# Patient Record
Sex: Female | Born: 1975
Health system: Southern US, Community
[De-identification: ages and names within clinical notes are randomized; demographics above are authoritative.]

## PROBLEM LIST (undated history)

## (undated) DIAGNOSIS — M1990 Unspecified osteoarthritis, unspecified site: Secondary | ICD-10-CM

## (undated) DIAGNOSIS — N390 Urinary tract infection, site not specified: Secondary | ICD-10-CM

## (undated) DIAGNOSIS — T7840XA Allergy, unspecified, initial encounter: Secondary | ICD-10-CM

## (undated) DIAGNOSIS — Z8619 Personal history of other infectious and parasitic diseases: Secondary | ICD-10-CM

## (undated) DIAGNOSIS — G43909 Migraine, unspecified, not intractable, without status migrainosus: Secondary | ICD-10-CM

## (undated) DIAGNOSIS — H35039 Hypertensive retinopathy, unspecified eye: Secondary | ICD-10-CM

## (undated) DIAGNOSIS — E559 Vitamin D deficiency, unspecified: Secondary | ICD-10-CM

## (undated) DIAGNOSIS — E785 Hyperlipidemia, unspecified: Secondary | ICD-10-CM

## (undated) DIAGNOSIS — I10 Essential (primary) hypertension: Secondary | ICD-10-CM

## (undated) HISTORY — DX: Urinary tract infection, site not specified: N39.0

## (undated) HISTORY — DX: Hypertensive retinopathy, unspecified eye: H35.039

## (undated) HISTORY — DX: Unspecified osteoarthritis, unspecified site: M19.90

## (undated) HISTORY — PX: TONSILLECTOMY: SUR1361

## (undated) HISTORY — DX: Migraine, unspecified, not intractable, without status migrainosus: G43.909

## (undated) HISTORY — DX: Personal history of other infectious and parasitic diseases: Z86.19

## (undated) HISTORY — PX: ABDOMINAL HYSTERECTOMY: SHX81

## (undated) HISTORY — PX: REDUCTION MAMMAPLASTY: SUR839

## (undated) HISTORY — DX: Allergy, unspecified, initial encounter: T78.40XA

## (undated) HISTORY — DX: Vitamin D deficiency, unspecified: E55.9

## (undated) HISTORY — PX: TOTAL ABDOMINAL HYSTERECTOMY: SHX209

## (undated) HISTORY — DX: Hyperlipidemia, unspecified: E78.5

---

## 2000-02-14 HISTORY — PX: BREAST REDUCTION SURGERY: SHX8

## 2005-02-13 HISTORY — PX: TOTAL ABDOMINAL HYSTERECTOMY: SHX209

## 2005-02-13 HISTORY — PX: BLADDER SUSPENSION: SHX72

## 2014-12-04 LAB — HM HEPATITIS C SCREENING LAB: HM Hepatitis Screen: NEGATIVE

## 2015-02-12 DIAGNOSIS — K760 Fatty (change of) liver, not elsewhere classified: Secondary | ICD-10-CM

## 2015-02-12 HISTORY — DX: Fatty (change of) liver, not elsewhere classified: K76.0

## 2016-10-18 DIAGNOSIS — K5790 Diverticulosis of intestine, part unspecified, without perforation or abscess without bleeding: Secondary | ICD-10-CM

## 2016-10-18 HISTORY — PX: COLONOSCOPY: SHX174

## 2016-10-18 HISTORY — DX: Diverticulosis of intestine, part unspecified, without perforation or abscess without bleeding: K57.90

## 2018-04-04 LAB — HM HIV SCREENING LAB: HM HIV Screening: NEGATIVE

## 2018-10-31 ENCOUNTER — Other Ambulatory Visit: Payer: Self-pay

## 2018-10-31 ENCOUNTER — Ambulatory Visit (HOSPITAL_COMMUNITY)
Admission: EM | Admit: 2018-10-31 | Discharge: 2018-10-31 | Disposition: A | Discharge status: Home / Self Care | Payer: BC Managed Care – PPO

## 2018-10-31 ENCOUNTER — Encounter (HOSPITAL_COMMUNITY): Payer: Self-pay

## 2018-10-31 ENCOUNTER — Ambulatory Visit (INDEPENDENT_AMBULATORY_CARE_PROVIDER_SITE_OTHER): Payer: BC Managed Care – PPO

## 2018-10-31 DIAGNOSIS — S300XXA Contusion of lower back and pelvis, initial encounter: Secondary | ICD-10-CM | POA: Diagnosis not present

## 2018-10-31 DIAGNOSIS — W19XXXA Unspecified fall, initial encounter: Secondary | ICD-10-CM | POA: Diagnosis not present

## 2018-10-31 DIAGNOSIS — M545 Low back pain, unspecified: Secondary | ICD-10-CM

## 2018-10-31 DIAGNOSIS — Z8739 Personal history of other diseases of the musculoskeletal system and connective tissue: Secondary | ICD-10-CM

## 2018-10-31 DIAGNOSIS — W109XXA Fall (on) (from) unspecified stairs and steps, initial encounter: Secondary | ICD-10-CM | POA: Diagnosis not present

## 2018-10-31 DIAGNOSIS — T148XXA Other injury of unspecified body region, initial encounter: Secondary | ICD-10-CM

## 2018-10-31 HISTORY — DX: Essential (primary) hypertension: I10

## 2018-10-31 MED ORDER — KETOROLAC TROMETHAMINE 60 MG/2ML IM SOLN
INTRAMUSCULAR | Status: AC
  Filled 2018-10-31: qty 2

## 2018-10-31 MED ORDER — NAPROXEN 500 MG PO TABS: 500.0000 mg | ORAL_TABLET | Freq: Two times a day (BID) | ORAL | 0 refills | Status: DC

## 2018-10-31 MED ORDER — CYCLOBENZAPRINE HCL 5 MG PO TABS: 5.0000 mg | ORAL_TABLET | Freq: Three times a day (TID) | ORAL | 0 refills | Status: DC | PRN

## 2018-10-31 MED ORDER — KETOROLAC TROMETHAMINE 60 MG/2ML IM SOLN
60.0000 mg | Freq: Once | INTRAMUSCULAR | Status: AC
  Administered 2018-10-31: 60 mg via INTRAMUSCULAR

## 2018-10-31 NOTE — ED Provider Notes (Signed)
MRN: [id number] DOB: 08-13-1975  Subjective:   Kelli May is a 43 y.o. female presenting for acute onset moderate to severe worsening low back pain s/p fall directly onto stairs today.  Patient slipped, fell backwards onto her low back and feels like her purse broke some of her fall.  She did hurt her head as well but very lightly.  She is having a difficult time ambulating due to her pain.  Denies loss of consciousness.  Denies weakness, incontinence, inability to defecate or urinate, numbness or tingling, laceration, weakness.  She has not taken anything for pain as she came straight here.  Reports a history of scoliosis and arthritis.   No current facility-administered medications for this encounter.   Current Outpatient Medications:  .  calcium citrate-vitamin D (CITRACAL+D) 315-200 MG-UNIT tablet, Take 1 tablet by mouth 2 (two) times daily., Disp: , Rfl:  .  cholecalciferol (VITAMIN D3) 25 MCG (1000 UT) tablet, Take 1,000 Units by mouth daily., Disp: , Rfl:  .  cyclobenzaprine (FLEXERIL) 5 MG tablet, Take 5 mg by mouth 3 (three) times daily as needed for muscle spasms., Disp: , Rfl:  .  lisinopril (ZESTRIL) 5 MG tablet, Take 5 mg by mouth daily., Disp: , Rfl:  .  montelukast (SINGULAIR) 10 MG tablet, Take 10 mg by mouth at bedtime., Disp: , Rfl:     Allergies  Allergen Reactions  . Penicillins Hives    Past Medical History:  Diagnosis Date  . Hypertension      Past Surgical History:  Procedure Laterality Date  . ABDOMINAL HYSTERECTOMY      ROS  Objective:   Vitals: BP (!) 126/93 (BP Location: Right Arm)   Pulse 98   Temp 97.9 F (36.6 C) (Oral)   Resp 18   Wt 197 lb (89.4 kg)   SpO2 98%   Physical Exam Constitutional:      General: She is not in acute distress.    Appearance: Normal appearance. She is well-developed. She is not ill-appearing.  HENT:     Head: Normocephalic and atraumatic.     Nose: Nose normal.     Mouth/Throat:     Mouth: Mucous  membranes are moist.     Pharynx: Oropharynx is clear.  Eyes:     General: No scleral icterus.    Extraocular Movements: Extraocular movements intact.     Pupils: Pupils are equal, round, and reactive to light.  Cardiovascular:     Rate and Rhythm: Normal rate.  Pulmonary:     Effort: Pulmonary effort is normal.  Musculoskeletal:     Lumbar back: She exhibits decreased range of motion, tenderness and swelling (Associated with her abrasion). She exhibits no deformity and no laceration.       Back:  Skin:    General: Skin is warm and dry.  Neurological:     General: No focal deficit present.     Mental Status: She is alert and oriented to person, place, and time.     Cranial Nerves: No cranial nerve deficit.     Motor: No weakness.     Coordination: Coordination abnormal (difficulty rising from a seated position due to her low back pain).     Deep Tendon Reflexes: Reflexes normal.  Psychiatric:        Mood and Affect: Mood normal.        Behavior: Behavior normal.    Dg Lumbar Spine Complete  Result Date: 10/31/2018 CLINICAL DATA:  Per pt: around 11 this  morning, fell down four steps in the house. Pain is the lumbar spine, posterior, left, lateral to the 4th & 5th lumbar. No prior injury to the lumbar spine. Has scoliosis, osteoarthritis. Non smoker. Patient is not a diabetic EXAM: LUMBAR SPINE - COMPLETE 4+ VIEW COMPARISON:  None. FINDINGS: There is no evidence of lumbar spine fracture. Alignment is normal. Intervertebral disc spaces are maintained. IMPRESSION: Negative. Electronically Signed   By: Lajean Manes M.D.   On: 10/31/2018 14:28    Assessment and Plan :   1. Acute left-sided low back pain without sciatica   2. Fall, initial encounter   3. Abrasion   4. Contusion of lower back, initial encounter   5. History of scoliosis     We will manage conservatively for musculoskeletal type pain associated with her fall.  Counseled on use of NSAID, muscle relaxant and  modification of physical activity.  Anticipatory guidance provided.  Counseled patient on potential for adverse effects with medications prescribed/recommended today, ER and return-to-clinic precautions discussed, patient verbalized understanding.    Jaynee Eagles, PA-C 10/31/18 1438

## 2018-10-31 NOTE — ED Triage Notes (Signed)
Pt states she fall down some steps. This happened today. Pt states she having lower back pain.

## 2019-02-10 DIAGNOSIS — R0602 Shortness of breath: Secondary | ICD-10-CM | POA: Diagnosis not present

## 2019-03-24 DIAGNOSIS — S134XXA Sprain of ligaments of cervical spine, initial encounter: Secondary | ICD-10-CM | POA: Diagnosis not present

## 2019-03-24 DIAGNOSIS — S233XXA Sprain of ligaments of thoracic spine, initial encounter: Secondary | ICD-10-CM | POA: Diagnosis not present

## 2019-03-24 DIAGNOSIS — S338XXA Sprain of other parts of lumbar spine and pelvis, initial encounter: Secondary | ICD-10-CM | POA: Diagnosis not present

## 2019-03-31 DIAGNOSIS — S134XXA Sprain of ligaments of cervical spine, initial encounter: Secondary | ICD-10-CM | POA: Diagnosis not present

## 2019-03-31 DIAGNOSIS — S338XXA Sprain of other parts of lumbar spine and pelvis, initial encounter: Secondary | ICD-10-CM | POA: Diagnosis not present

## 2019-03-31 DIAGNOSIS — S233XXA Sprain of ligaments of thoracic spine, initial encounter: Secondary | ICD-10-CM | POA: Diagnosis not present

## 2019-04-07 DIAGNOSIS — S134XXA Sprain of ligaments of cervical spine, initial encounter: Secondary | ICD-10-CM | POA: Diagnosis not present

## 2019-04-07 DIAGNOSIS — S233XXA Sprain of ligaments of thoracic spine, initial encounter: Secondary | ICD-10-CM | POA: Diagnosis not present

## 2019-04-07 DIAGNOSIS — S338XXA Sprain of other parts of lumbar spine and pelvis, initial encounter: Secondary | ICD-10-CM | POA: Diagnosis not present

## 2019-04-09 DIAGNOSIS — S134XXA Sprain of ligaments of cervical spine, initial encounter: Secondary | ICD-10-CM | POA: Diagnosis not present

## 2019-04-09 DIAGNOSIS — S233XXA Sprain of ligaments of thoracic spine, initial encounter: Secondary | ICD-10-CM | POA: Diagnosis not present

## 2019-04-09 DIAGNOSIS — S338XXA Sprain of other parts of lumbar spine and pelvis, initial encounter: Secondary | ICD-10-CM | POA: Diagnosis not present

## 2019-04-16 DIAGNOSIS — S134XXA Sprain of ligaments of cervical spine, initial encounter: Secondary | ICD-10-CM | POA: Diagnosis not present

## 2019-04-16 DIAGNOSIS — S338XXA Sprain of other parts of lumbar spine and pelvis, initial encounter: Secondary | ICD-10-CM | POA: Diagnosis not present

## 2019-04-16 DIAGNOSIS — S233XXA Sprain of ligaments of thoracic spine, initial encounter: Secondary | ICD-10-CM | POA: Diagnosis not present

## 2019-04-21 DIAGNOSIS — S338XXA Sprain of other parts of lumbar spine and pelvis, initial encounter: Secondary | ICD-10-CM | POA: Diagnosis not present

## 2019-04-21 DIAGNOSIS — S134XXA Sprain of ligaments of cervical spine, initial encounter: Secondary | ICD-10-CM | POA: Diagnosis not present

## 2019-04-21 DIAGNOSIS — S233XXA Sprain of ligaments of thoracic spine, initial encounter: Secondary | ICD-10-CM | POA: Diagnosis not present

## 2019-04-23 DIAGNOSIS — S233XXA Sprain of ligaments of thoracic spine, initial encounter: Secondary | ICD-10-CM | POA: Diagnosis not present

## 2019-04-23 DIAGNOSIS — S338XXA Sprain of other parts of lumbar spine and pelvis, initial encounter: Secondary | ICD-10-CM | POA: Diagnosis not present

## 2019-04-23 DIAGNOSIS — S134XXA Sprain of ligaments of cervical spine, initial encounter: Secondary | ICD-10-CM | POA: Diagnosis not present

## 2019-04-28 DIAGNOSIS — S233XXA Sprain of ligaments of thoracic spine, initial encounter: Secondary | ICD-10-CM | POA: Diagnosis not present

## 2019-04-28 DIAGNOSIS — S134XXA Sprain of ligaments of cervical spine, initial encounter: Secondary | ICD-10-CM | POA: Diagnosis not present

## 2019-04-28 DIAGNOSIS — S338XXA Sprain of other parts of lumbar spine and pelvis, initial encounter: Secondary | ICD-10-CM | POA: Diagnosis not present

## 2019-05-05 DIAGNOSIS — S233XXA Sprain of ligaments of thoracic spine, initial encounter: Secondary | ICD-10-CM | POA: Diagnosis not present

## 2019-05-05 DIAGNOSIS — S134XXA Sprain of ligaments of cervical spine, initial encounter: Secondary | ICD-10-CM | POA: Diagnosis not present

## 2019-05-05 DIAGNOSIS — S338XXA Sprain of other parts of lumbar spine and pelvis, initial encounter: Secondary | ICD-10-CM | POA: Diagnosis not present

## 2019-05-14 DIAGNOSIS — S233XXA Sprain of ligaments of thoracic spine, initial encounter: Secondary | ICD-10-CM | POA: Diagnosis not present

## 2019-05-14 DIAGNOSIS — S134XXA Sprain of ligaments of cervical spine, initial encounter: Secondary | ICD-10-CM | POA: Diagnosis not present

## 2019-05-14 DIAGNOSIS — S338XXA Sprain of other parts of lumbar spine and pelvis, initial encounter: Secondary | ICD-10-CM | POA: Diagnosis not present

## 2019-05-21 DIAGNOSIS — S233XXA Sprain of ligaments of thoracic spine, initial encounter: Secondary | ICD-10-CM | POA: Diagnosis not present

## 2019-05-21 DIAGNOSIS — S134XXA Sprain of ligaments of cervical spine, initial encounter: Secondary | ICD-10-CM | POA: Diagnosis not present

## 2019-05-21 DIAGNOSIS — S338XXA Sprain of other parts of lumbar spine and pelvis, initial encounter: Secondary | ICD-10-CM | POA: Diagnosis not present

## 2019-05-28 DIAGNOSIS — S233XXA Sprain of ligaments of thoracic spine, initial encounter: Secondary | ICD-10-CM | POA: Diagnosis not present

## 2019-05-28 DIAGNOSIS — S338XXA Sprain of other parts of lumbar spine and pelvis, initial encounter: Secondary | ICD-10-CM | POA: Diagnosis not present

## 2019-05-28 DIAGNOSIS — S134XXA Sprain of ligaments of cervical spine, initial encounter: Secondary | ICD-10-CM | POA: Diagnosis not present

## 2019-06-02 DIAGNOSIS — S134XXA Sprain of ligaments of cervical spine, initial encounter: Secondary | ICD-10-CM | POA: Diagnosis not present

## 2019-06-02 DIAGNOSIS — S338XXA Sprain of other parts of lumbar spine and pelvis, initial encounter: Secondary | ICD-10-CM | POA: Diagnosis not present

## 2019-06-02 DIAGNOSIS — S233XXA Sprain of ligaments of thoracic spine, initial encounter: Secondary | ICD-10-CM | POA: Diagnosis not present

## 2019-06-19 ENCOUNTER — Ambulatory Visit: Payer: BC Managed Care – PPO | Admitting: Family Medicine

## 2019-06-25 DIAGNOSIS — S134XXA Sprain of ligaments of cervical spine, initial encounter: Secondary | ICD-10-CM | POA: Diagnosis not present

## 2019-06-25 DIAGNOSIS — S338XXA Sprain of other parts of lumbar spine and pelvis, initial encounter: Secondary | ICD-10-CM | POA: Diagnosis not present

## 2019-06-25 DIAGNOSIS — S233XXA Sprain of ligaments of thoracic spine, initial encounter: Secondary | ICD-10-CM | POA: Diagnosis not present

## 2019-07-07 DIAGNOSIS — S134XXA Sprain of ligaments of cervical spine, initial encounter: Secondary | ICD-10-CM | POA: Diagnosis not present

## 2019-07-07 DIAGNOSIS — S233XXA Sprain of ligaments of thoracic spine, initial encounter: Secondary | ICD-10-CM | POA: Diagnosis not present

## 2019-07-07 DIAGNOSIS — S338XXA Sprain of other parts of lumbar spine and pelvis, initial encounter: Secondary | ICD-10-CM | POA: Diagnosis not present

## 2019-07-16 DIAGNOSIS — S233XXA Sprain of ligaments of thoracic spine, initial encounter: Secondary | ICD-10-CM | POA: Diagnosis not present

## 2019-07-16 DIAGNOSIS — S134XXA Sprain of ligaments of cervical spine, initial encounter: Secondary | ICD-10-CM | POA: Diagnosis not present

## 2019-07-16 DIAGNOSIS — S338XXA Sprain of other parts of lumbar spine and pelvis, initial encounter: Secondary | ICD-10-CM | POA: Diagnosis not present

## 2019-07-28 DIAGNOSIS — S134XXA Sprain of ligaments of cervical spine, initial encounter: Secondary | ICD-10-CM | POA: Diagnosis not present

## 2019-07-28 DIAGNOSIS — S233XXA Sprain of ligaments of thoracic spine, initial encounter: Secondary | ICD-10-CM | POA: Diagnosis not present

## 2019-07-28 DIAGNOSIS — S338XXA Sprain of other parts of lumbar spine and pelvis, initial encounter: Secondary | ICD-10-CM | POA: Diagnosis not present

## 2019-08-11 DIAGNOSIS — S233XXA Sprain of ligaments of thoracic spine, initial encounter: Secondary | ICD-10-CM | POA: Diagnosis not present

## 2019-08-11 DIAGNOSIS — S134XXA Sprain of ligaments of cervical spine, initial encounter: Secondary | ICD-10-CM | POA: Diagnosis not present

## 2019-08-11 DIAGNOSIS — S338XXA Sprain of other parts of lumbar spine and pelvis, initial encounter: Secondary | ICD-10-CM | POA: Diagnosis not present

## 2019-08-15 ENCOUNTER — Other Ambulatory Visit: Payer: Self-pay

## 2019-08-15 ENCOUNTER — Ambulatory Visit (INDEPENDENT_AMBULATORY_CARE_PROVIDER_SITE_OTHER): Payer: BC Managed Care – PPO | Admitting: Family Medicine

## 2019-08-15 ENCOUNTER — Encounter: Payer: Self-pay | Admitting: Family Medicine

## 2019-08-15 ENCOUNTER — Ambulatory Visit (INDEPENDENT_AMBULATORY_CARE_PROVIDER_SITE_OTHER): Payer: BC Managed Care – PPO

## 2019-08-15 VITALS — BP 111/75 | HR 61 | Temp 98.2°F | Ht 66.0 in | Wt 229.6 lb

## 2019-08-15 DIAGNOSIS — I10 Essential (primary) hypertension: Secondary | ICD-10-CM | POA: Diagnosis not present

## 2019-08-15 DIAGNOSIS — J302 Other seasonal allergic rhinitis: Secondary | ICD-10-CM

## 2019-08-15 DIAGNOSIS — R5383 Other fatigue: Secondary | ICD-10-CM

## 2019-08-15 DIAGNOSIS — R42 Dizziness and giddiness: Secondary | ICD-10-CM | POA: Diagnosis not present

## 2019-08-15 DIAGNOSIS — R0602 Shortness of breath: Secondary | ICD-10-CM

## 2019-08-15 DIAGNOSIS — E559 Vitamin D deficiency, unspecified: Secondary | ICD-10-CM | POA: Diagnosis not present

## 2019-08-15 DIAGNOSIS — R519 Headache, unspecified: Secondary | ICD-10-CM

## 2019-08-15 MED ORDER — CYCLOBENZAPRINE HCL 5 MG PO TABS: 5.0000 mg | ORAL_TABLET | Freq: Three times a day (TID) | ORAL | 5 refills | Status: DC | PRN

## 2019-08-15 MED ORDER — VITAMIN D3 1.25 MG (50000 UT) PO CAPS: 1.0000 | ORAL_CAPSULE | ORAL | 2 refills | Status: DC

## 2019-08-15 MED ORDER — PROMETHAZINE HCL 25 MG PO TABS: 25.0000 mg | ORAL_TABLET | Freq: Four times a day (QID) | ORAL | 2 refills | Status: DC | PRN

## 2019-08-15 MED ORDER — MONTELUKAST SODIUM 10 MG PO TABS: 10.0000 mg | ORAL_TABLET | Freq: Every day | ORAL | 1 refills | Status: DC

## 2019-08-15 MED ORDER — LISINOPRIL 5 MG PO TABS: 5.0000 mg | ORAL_TABLET | Freq: Every day | ORAL | 1 refills | Status: DC

## 2019-08-15 MED ORDER — CETIRIZINE HCL 10 MG PO TABS: 10.0000 mg | ORAL_TABLET | Freq: Every day | ORAL | 1 refills | Status: AC

## 2019-08-15 MED ORDER — BUTALBITAL-APAP-CAFFEINE 50-325-40 MG PO TABS: 1.0000 | ORAL_TABLET | Freq: Two times a day (BID) | ORAL | 5 refills | Status: DC | PRN

## 2019-08-15 NOTE — Progress Notes (Signed)
New Patient Office Visit  Assessment & Plan:  1. Essential hypertension - Well controlled on current regimen.  - lisinopril (ZESTRIL) 5 MG tablet; Take 1 tablet (5 mg total) by mouth daily.  Dispense: 90 tablet; Refill: 1 - Lipid panel  2. Vitamin D deficiency - Cholecalciferol (VITAMIN D3) 1.25 MG (50000 UT) CAPS; Take 1 tablet by mouth 2 (two) times a week.  Dispense: 24 capsule; Refill: 2 - VITAMIN D 25 Hydroxy (Vit-D Deficiency, Fractures)  3. Frequent headaches - Discussed option of adding preventive treatment and a different abortive treatment.  We are going to do lab work first to ensure there is nothing there to be causing her headaches, as well as working on her fatigue. - butalbital-acetaminophen-caffeine (FIORICET) 50-325-40 MG tablet; Take 1-2 tablets by mouth 2 (two) times daily as needed for headache or migraine.  Dispense: 14 tablet; Refill: 5 - promethazine (PHENERGAN) 25 MG tablet; Take 1 tablet (25 mg total) by mouth every 6 (six) hours as needed for nausea or vomiting.  Dispense: 30 tablet; Refill: 2 - TSH  4. Shortness of breath - CBC with Differential/Platelet - Vitamin B12 - Folate - TSH - DG Chest 2 View  5. Dizziness - CBC with Differential/Platelet - CMP14+EGFR - Vitamin B12 - Folate - TSH  6. Fatigue, unspecified type - CBC with Differential/Platelet - CMP14+EGFR - Vitamin B12 - Folate - TSH  7. Seasonal allergies - Well controlled on current regimen.  - montelukast (SINGULAIR) 10 MG tablet; Take 1 tablet (10 mg total) by mouth at bedtime.  Dispense: 90 tablet; Refill: 1 - cetirizine (ZYRTEC) 10 MG tablet; Take 1 tablet (10 mg total) by mouth daily.  Dispense: 90 tablet; Refill: 1   Follow-up: Return as directed after labs result.   Hendricks Limes, MSN, APRN, FNP-C [facility]  Subjective:  Patient ID: Kelli May, female    DOB: 07/12/1975  Age: 44 y.o. MRN: [id number]  Patient Care Team: Loman Brooklyn, FNP as PCP - General (Family Medicine)  CC:  Chief Complaint  Patient presents with  . New Patient (Initial Visit)    [facility]   . Establish Care  . Migraine    Patient states it started today  . Fatigue    Patient states that has been going on since she had COVID in Dec 2020.      HPI Kelli May presents to establish care.  Patient reports she was diagnosed with hypertension by her eye doctor.  States she has never had an elevated blood pressure reading but that her retina shows changes indicative of hypertension, which is why she was started on lisinopril.  Patient reports her migraines are getting more frequent.  She has one that started this morning.  Her last one lasted for a whole week.  They got bad 3 years ago with her divorce and have not gotten better since.  She does have a prescription for Fioricet and states that sometimes it helps and sometimes it does not.  They are occurring approximately once weekly at this point.  She is sensitive to light and sound.  She denies any aura.  She does feel that she went from the stress of the divorce to the stress of being a single mom but does not feel she needs anything to treat anxiety.  She has never been on medication to prevent migraines prophylactically.  Nor has she been on a triptan for abortive treatment.  She wonders if her  migraines do not stem from her lack of sleep.  Patient feels fatigued pretty much every day.  States in the evening time it is so bad that she admits feels like she has been drinking where she feels dizzy and like she is going to pass out.  Patient does not drink alcohol except on rare occasions.  She has been experiencing shortness of breath with mild exertion and even just talking since she had bilateral pneumonia with COVID-19 at the end of last year.   Review of Systems  Constitutional: Negative for chills, fever, malaise/fatigue and weight loss.  HENT: Negative for  congestion, ear discharge, ear pain, nosebleeds, sinus pain, sore throat and tinnitus.   Eyes: Negative for blurred vision, double vision, pain, discharge and redness.  Respiratory: Positive for shortness of breath. Negative for cough and wheezing.   Cardiovascular: Negative for chest pain, palpitations and leg swelling.  Gastrointestinal: Negative for abdominal pain, constipation, diarrhea, heartburn, nausea and vomiting.  Genitourinary: Negative for dysuria, frequency and urgency.  Musculoskeletal: Negative for myalgias.  Skin: Negative for rash.  Neurological: Positive for headaches. Negative for dizziness, seizures and weakness.  Psychiatric/Behavioral: Negative for depression, substance abuse and suicidal ideas. The patient is not nervous/anxious.     Current Outpatient Medications:  .  aspirin EC 81 MG tablet, Take 81 mg by mouth daily. Swallow whole., Disp: , Rfl:  .  butalbital-acetaminophen-caffeine (FIORICET) 50-325-40 MG tablet, Take by mouth 2 (two) times daily as needed for headache., Disp: , Rfl:  .  cetirizine (ZYRTEC) 10 MG tablet, Take 10 mg by mouth daily., Disp: , Rfl:  .  cholecalciferol (VITAMIN D3) 25 MCG (1000 UT) tablet, Take 1,000 Units by mouth daily., Disp: , Rfl:  .  cyclobenzaprine (FLEXERIL) 5 MG tablet, Take 5 mg by mouth 3 (three) times daily as needed for muscle spasms., Disp: , Rfl:  .  lisinopril (ZESTRIL) 5 MG tablet, Take 5 mg by mouth daily., Disp: , Rfl:  .  montelukast (SINGULAIR) 10 MG tablet, Take 10 mg by mouth at bedtime., Disp: , Rfl:  .  promethazine (PHENERGAN) 12.5 MG tablet, Take 12.5 mg by mouth every 6 (six) hours as needed for nausea or vomiting., Disp: , Rfl:   Allergies  Allergen Reactions  . Penicillins Hives    Past Medical History:  Diagnosis Date  . Allergy   . Arthritis   . Hypertension   . Vitamin D deficiency     Past Surgical History:  Procedure Laterality Date  . ABDOMINAL HYSTERECTOMY    . BREAST REDUCTION SURGERY   2002  . TONSILLECTOMY      Family History  Problem Relation Age of Onset  . Bipolar disorder Mother   . Depression Mother   . Hyperlipidemia Father   . Hypertension Father   . Depression Father   . Epilepsy Daughter   . ADD / ADHD Daughter   . AAA (abdominal aortic aneurysm) Daughter   . ADD / ADHD Son   . Allergies Son   . Diabetes Maternal Grandfather   . Cancer Paternal Grandmother   . Heart disease Paternal Grandfather     Social History   Socioeconomic History  . Marital status: Legally Separated    Spouse name: Not on file  . Number of children: Not on file  . Years of education: Not on file  . Highest education level: Not on file  Occupational History  . Not on file  Tobacco Use  . Smoking status: Former Smoker  Types: Cigarettes    Quit date: 2001    Years since quitting: 20.5  . Smokeless tobacco: Never Used  Vaping Use  . Vaping Use: Never used  Substance and Sexual Activity  . Alcohol use: Yes    Comment: rare  . Drug use: Never  . Sexual activity: Not Currently  Other Topics Concern  . Not on file  Social History Narrative  . Not on file   Social Determinants of Health   Financial Resource Strain:   . Difficulty of Paying Living Expenses:   Food Insecurity:   . Worried About Charity fundraiser in the Last Year:   . Arboriculturist in the Last Year:   Transportation Needs:   . Film/video editor (Medical):   Marland Kitchen Lack of Transportation (Non-Medical):   Physical Activity:   . Days of Exercise per Week:   . Minutes of Exercise per Session:   Stress:   . Feeling of Stress :   Social Connections:   . Frequency of Communication with Friends and Family:   . Frequency of Social Gatherings with Friends and Family:   . Attends Religious Services:   . Active Member of Clubs or Organizations:   . Attends Archivist Meetings:   Marland Kitchen Marital Status:   Intimate Partner Violence:   . Fear of Current or Ex-Partner:   . Emotionally Abused:    Marland Kitchen Physically Abused:   . Sexually Abused:     Objective:   Today's Vitals: BP 111/75   Pulse 61   Temp 98.2 F (36.8 C) (Temporal)   Ht _0  (1.676 m)   Wt 229 lb 9.6 oz (104.1 kg)   SpO2 99%   BMI 37.06 kg/m   Physical Exam Vitals reviewed.  Constitutional:      General: She is not in acute distress.    Appearance: Normal appearance. She is obese. She is not ill-appearing, toxic-appearing or diaphoretic.  HENT:     Head: Normocephalic and atraumatic.  Eyes:     General: No scleral icterus.       Right eye: No discharge.        Left eye: No discharge.     Conjunctiva/sclera: Conjunctivae normal.  Cardiovascular:     Rate and Rhythm: Normal rate and regular rhythm.     Heart sounds: Normal heart sounds. No murmur heard.  No friction rub. No gallop.   Pulmonary:     Effort: Pulmonary effort is normal. No respiratory distress.     Breath sounds: Normal breath sounds. No stridor. No wheezing, rhonchi or rales.  Musculoskeletal:        General: Normal range of motion.     Cervical back: Normal range of motion.  Skin:    General: Skin is warm and dry.     Capillary Refill: Capillary refill takes less than 2 seconds.  Neurological:     General: No focal deficit present.     Mental Status: She is alert and oriented to person, place, and time. Mental status is at baseline.  Psychiatric:        Mood and Affect: Mood normal.        Behavior: Behavior normal.        Thought Content: Thought content normal.        Judgment: Judgment normal.

## 2019-08-16 LAB — CMP14+EGFR
ALT: 18 IU/L (ref 0–32)
AST: 18 IU/L (ref 0–40)
Albumin/Globulin Ratio: 1.5 (ref 1.2–2.2)
Albumin: 4.2 g/dL (ref 3.8–4.8)
Alkaline Phosphatase: 94 IU/L (ref 48–121)
BUN/Creatinine Ratio: 12 (ref 9–23)
BUN: 9 mg/dL (ref 6–24)
Bilirubin Total: 0.5 mg/dL (ref 0.0–1.2)
CO2: 22 mmol/L (ref 20–29)
Calcium: 9.7 mg/dL (ref 8.7–10.2)
Chloride: 102 mmol/L (ref 96–106)
Creatinine, Ser: 0.75 mg/dL (ref 0.57–1.00)
GFR calc Af Amer: 114 mL/min/{1.73_m2} (ref >59)
GFR calc non Af Amer: 99 mL/min/{1.73_m2} (ref >59)
Globulin, Total: 2.8 g/dL (ref 1.5–4.5)
Glucose: 86 mg/dL (ref 65–99)
Potassium: 4.3 mmol/L (ref 3.5–5.2)
Sodium: 138 mmol/L (ref 134–144)
Total Protein: 7 g/dL (ref 6.0–8.5)

## 2019-08-16 LAB — CBC WITH DIFFERENTIAL/PLATELET
Basophils Absolute: 0.1 10*3/uL (ref 0.0–0.2)
Basos: 1 %
EOS (ABSOLUTE): 0.1 10*3/uL (ref 0.0–0.4)
Eos: 1 %
Hematocrit: 38.7 % (ref 34.0–46.6)
Hemoglobin: 13.6 g/dL (ref 11.1–15.9)
Immature Grans (Abs): 0 10*3/uL (ref 0.0–0.1)
Immature Granulocytes: 0 %
Lymphocytes Absolute: 1.9 10*3/uL (ref 0.7–3.1)
Lymphs: 25 %
MCH: 29.5 pg (ref 26.6–33.0)
MCHC: 35.1 g/dL (ref 31.5–35.7)
MCV: 84 fL (ref 79–97)
Monocytes Absolute: 0.5 10*3/uL (ref 0.1–0.9)
Monocytes: 7 %
Neutrophils Absolute: 5.2 10*3/uL (ref 1.4–7.0)
Neutrophils: 66 %
Platelets: 254 10*3/uL (ref 150–450)
RBC: 4.61 x10E6/uL (ref 3.77–5.28)
RDW: 11.9 % (ref 11.7–15.4)
WBC: 7.8 10*3/uL (ref 3.4–10.8)

## 2019-08-16 LAB — LIPID PANEL
Chol/HDL Ratio: 4.7 ratio — ABNORMAL HIGH (ref 0.0–4.4)
Cholesterol, Total: 201 mg/dL — ABNORMAL HIGH (ref 100–199)
HDL: 43 mg/dL (ref >39)
LDL Chol Calc (NIH): 133 mg/dL — ABNORMAL HIGH (ref 0–99)
Triglycerides: 137 mg/dL (ref 0–149)
VLDL Cholesterol Cal: 25 mg/dL (ref 5–40)

## 2019-08-16 LAB — FOLATE: Folate: 15.7 ng/mL (ref >3.0)

## 2019-08-16 LAB — VITAMIN B12: Vitamin B-12: 447 pg/mL (ref 232–1245)

## 2019-08-16 LAB — TSH: TSH: 1.81 u[IU]/mL (ref 0.450–4.500)

## 2019-08-16 LAB — VITAMIN D 25 HYDROXY (VIT D DEFICIENCY, FRACTURES): Vit D, 25-Hydroxy: 41.1 ng/mL (ref 30.0–100.0)

## 2019-08-17 DIAGNOSIS — J302 Other seasonal allergic rhinitis: Secondary | ICD-10-CM | POA: Insufficient documentation

## 2019-08-17 DIAGNOSIS — R0609 Other forms of dyspnea: Secondary | ICD-10-CM | POA: Insufficient documentation

## 2019-08-17 DIAGNOSIS — R519 Headache, unspecified: Secondary | ICD-10-CM | POA: Insufficient documentation

## 2019-08-25 DIAGNOSIS — S233XXA Sprain of ligaments of thoracic spine, initial encounter: Secondary | ICD-10-CM | POA: Diagnosis not present

## 2019-08-25 DIAGNOSIS — S134XXA Sprain of ligaments of cervical spine, initial encounter: Secondary | ICD-10-CM | POA: Diagnosis not present

## 2019-08-25 DIAGNOSIS — S338XXA Sprain of other parts of lumbar spine and pelvis, initial encounter: Secondary | ICD-10-CM | POA: Diagnosis not present

## 2019-09-08 DIAGNOSIS — S338XXA Sprain of other parts of lumbar spine and pelvis, initial encounter: Secondary | ICD-10-CM | POA: Diagnosis not present

## 2019-09-08 DIAGNOSIS — S134XXA Sprain of ligaments of cervical spine, initial encounter: Secondary | ICD-10-CM | POA: Diagnosis not present

## 2019-09-08 DIAGNOSIS — S233XXA Sprain of ligaments of thoracic spine, initial encounter: Secondary | ICD-10-CM | POA: Diagnosis not present

## 2019-10-06 DIAGNOSIS — S335XXA Sprain of ligaments of lumbar spine, initial encounter: Secondary | ICD-10-CM | POA: Diagnosis not present

## 2019-10-06 DIAGNOSIS — S233XXA Sprain of ligaments of thoracic spine, initial encounter: Secondary | ICD-10-CM | POA: Diagnosis not present

## 2019-10-06 DIAGNOSIS — S134XXA Sprain of ligaments of cervical spine, initial encounter: Secondary | ICD-10-CM | POA: Diagnosis not present

## 2019-10-22 DIAGNOSIS — S134XXA Sprain of ligaments of cervical spine, initial encounter: Secondary | ICD-10-CM | POA: Diagnosis not present

## 2019-10-22 DIAGNOSIS — S233XXA Sprain of ligaments of thoracic spine, initial encounter: Secondary | ICD-10-CM | POA: Diagnosis not present

## 2019-10-22 DIAGNOSIS — S335XXA Sprain of ligaments of lumbar spine, initial encounter: Secondary | ICD-10-CM | POA: Diagnosis not present

## 2019-12-05 ENCOUNTER — Telehealth: Payer: Self-pay

## 2019-12-05 NOTE — Telephone Encounter (Signed)
Pt called requesting to speak with a nurse regarding questions about her sugar.

## 2019-12-05 NOTE — Telephone Encounter (Signed)
Patient had questions about what her fasting BS should run.  Answered question and patient verbalizes understanding.

## 2020-02-12 ENCOUNTER — Other Ambulatory Visit: Payer: Self-pay

## 2020-02-12 ENCOUNTER — Ambulatory Visit (INDEPENDENT_AMBULATORY_CARE_PROVIDER_SITE_OTHER): Payer: BC Managed Care – PPO | Admitting: Nurse Practitioner

## 2020-02-12 ENCOUNTER — Encounter: Payer: Self-pay | Admitting: Nurse Practitioner

## 2020-02-12 DIAGNOSIS — U071 COVID-19: Secondary | ICD-10-CM | POA: Insufficient documentation

## 2020-02-12 MED ORDER — ALBUTEROL SULFATE (2.5 MG/3ML) 0.083% IN NEBU: 2.5000 mg | INHALATION_SOLUTION | Freq: Four times a day (QID) | RESPIRATORY_TRACT | 1 refills | Status: DC | PRN

## 2020-02-12 MED ORDER — PREDNISONE 10 MG (21) PO TBPK: ORAL_TABLET | ORAL | 0 refills | Status: DC

## 2020-02-12 MED ORDER — AZITHROMYCIN 250 MG PO TABS: ORAL_TABLET | ORAL | 0 refills | Status: DC

## 2020-02-12 NOTE — Assessment & Plan Note (Addendum)
Patient is positive for COVID-19 as of 02/11/2020.  Patient has been positive for COVID-19 in the past year January 31, 2019.  Patient has not yet been vaccinated.  Patient reports the last time she had Covid she experienced bilateral pneumonia and was very sick.  Patient is reporting fevers in the last 2 days and cough with temperatures of 100.9 on day one and 100.4-day 2.  patient's temperature is 99.4 after taking Motrin.  Started patient on prednisone taper, azithromycin, [Z-Pak], Tylenol for fever, albuterol breathing treatment, contacted Covid infusion clinic.  Advised patient to increase hydration, go to the emergency department with worsening symptoms i.e. shortness of breath and unresolved fever.  Rx sent to pharmacy.

## 2020-02-12 NOTE — Progress Notes (Signed)
   Virtual Visit via telephone Note Due to COVID-19 pandemic this visit was conducted virtually. This visit type was conducted due to national recommendations for restrictions regarding the COVID-19 Pandemic (e.g. social distancing, sheltering in place) in an effort to limit this patient's exposure and mitigate transmission in our community. All issues noted in this document were discussed and addressed.  A physical exam was not performed with this format.  I connected with Kelli May on 02/12/20 at 11:10 AM   by telephone and verified that I am speaking with the correct person using two identifiers. Kelli May is currently located at home and no one is currently with patient during visit. The provider, Daryll Drown, NP is located in their office at time of visit.  I discussed the limitations, risks, security and privacy concerns of performing an evaluation and management service by telephone and the availability of in person appointments. I also discussed with the patient that there may be a patient responsible charge related to this service. The patient expressed understanding and agreed to proceed.   History and Present Illness:  Cough This is a new problem. The current episode started yesterday. The problem has been gradually worsening. The cough is non-productive. Associated symptoms include a fever. Pertinent negatives include no ear congestion, headaches or shortness of breath. Exacerbated by: Positive COVID-19. She has tried nothing for the symptoms. Her past medical history is significant for pneumonia. There is no history of asthma, bronchitis or COPD.      Review of Systems  Constitutional: Positive for fever.  Respiratory: Positive for cough. Negative for shortness of breath.   Neurological: Negative for headaches.     Observations/Objective: Televisit Assessment and Plan:  Patient is positive for COVID-19 as of 02/11/2020.  Patient has been  positive for COVID-19 in the past year January 31, 2019.  Patient has not yet been vaccinated.  Patient reports the last time she had Covid she experienced bilateral pneumonia and was very sick.  Patient is reporting fevers in the last 2 days and cough with temperatures of 100.9 on day one and 100.4-day 2.  patient's temperature is 99.4 after taking Motrin.  Started patient on prednisone taper, azithromycin, [Z-Pak], Tylenol for fever, albuterol breathing treatment, contacted Covid infusion clinic.  Advised patient to increase hydration, go to the emergency department with worsening symptoms i.e. shortness of breath and unresolved fever.  Follow Up Instructions: Follow-up as needed    I discussed the assessment and treatment plan with the patient. The patient was provided an opportunity to ask questions and all were answered. The patient agreed with the plan and demonstrated an understanding of the instructions.   The patient was advised to call back or seek an in-person evaluation if the symptoms worsen or if the condition fails to improve as anticipated.  The above assessment and management plan was discussed with the patient. The patient verbalized understanding of and has agreed to the management plan. Patient is aware to call the clinic if symptoms persist or worsen. Patient is aware when to return to the clinic for a follow-up visit. Patient educated on when it is appropriate to go to the emergency department.   Time call ended: 11:25 AM I provided 15 minutes of non-face-to-face time during this encounter.    Daryll Drown, NP

## 2020-03-10 DIAGNOSIS — S134XXA Sprain of ligaments of cervical spine, initial encounter: Secondary | ICD-10-CM | POA: Diagnosis not present

## 2020-03-10 DIAGNOSIS — S233XXA Sprain of ligaments of thoracic spine, initial encounter: Secondary | ICD-10-CM | POA: Diagnosis not present

## 2020-03-10 DIAGNOSIS — S335XXA Sprain of ligaments of lumbar spine, initial encounter: Secondary | ICD-10-CM | POA: Diagnosis not present

## 2020-03-22 DIAGNOSIS — S335XXA Sprain of ligaments of lumbar spine, initial encounter: Secondary | ICD-10-CM | POA: Diagnosis not present

## 2020-03-22 DIAGNOSIS — S233XXA Sprain of ligaments of thoracic spine, initial encounter: Secondary | ICD-10-CM | POA: Diagnosis not present

## 2020-03-22 DIAGNOSIS — S134XXA Sprain of ligaments of cervical spine, initial encounter: Secondary | ICD-10-CM | POA: Diagnosis not present

## 2020-03-27 ENCOUNTER — Other Ambulatory Visit: Payer: Self-pay | Admitting: Family Medicine

## 2020-03-27 DIAGNOSIS — I10 Essential (primary) hypertension: Secondary | ICD-10-CM

## 2020-03-27 DIAGNOSIS — J302 Other seasonal allergic rhinitis: Secondary | ICD-10-CM

## 2020-04-05 DIAGNOSIS — S233XXA Sprain of ligaments of thoracic spine, initial encounter: Secondary | ICD-10-CM | POA: Diagnosis not present

## 2020-04-05 DIAGNOSIS — S335XXA Sprain of ligaments of lumbar spine, initial encounter: Secondary | ICD-10-CM | POA: Diagnosis not present

## 2020-04-05 DIAGNOSIS — S134XXA Sprain of ligaments of cervical spine, initial encounter: Secondary | ICD-10-CM | POA: Diagnosis not present

## 2020-04-12 DIAGNOSIS — S134XXA Sprain of ligaments of cervical spine, initial encounter: Secondary | ICD-10-CM | POA: Diagnosis not present

## 2020-04-12 DIAGNOSIS — S233XXA Sprain of ligaments of thoracic spine, initial encounter: Secondary | ICD-10-CM | POA: Diagnosis not present

## 2020-04-12 DIAGNOSIS — S335XXA Sprain of ligaments of lumbar spine, initial encounter: Secondary | ICD-10-CM | POA: Diagnosis not present

## 2020-04-19 DIAGNOSIS — S233XXA Sprain of ligaments of thoracic spine, initial encounter: Secondary | ICD-10-CM | POA: Diagnosis not present

## 2020-04-19 DIAGNOSIS — S134XXA Sprain of ligaments of cervical spine, initial encounter: Secondary | ICD-10-CM | POA: Diagnosis not present

## 2020-04-19 DIAGNOSIS — S335XXA Sprain of ligaments of lumbar spine, initial encounter: Secondary | ICD-10-CM | POA: Diagnosis not present

## 2020-04-20 ENCOUNTER — Other Ambulatory Visit: Payer: Self-pay | Admitting: Family Medicine

## 2020-04-20 DIAGNOSIS — I10 Essential (primary) hypertension: Secondary | ICD-10-CM

## 2020-04-20 DIAGNOSIS — J302 Other seasonal allergic rhinitis: Secondary | ICD-10-CM

## 2020-04-26 DIAGNOSIS — S335XXA Sprain of ligaments of lumbar spine, initial encounter: Secondary | ICD-10-CM | POA: Diagnosis not present

## 2020-04-26 DIAGNOSIS — S233XXA Sprain of ligaments of thoracic spine, initial encounter: Secondary | ICD-10-CM | POA: Diagnosis not present

## 2020-04-26 DIAGNOSIS — S134XXA Sprain of ligaments of cervical spine, initial encounter: Secondary | ICD-10-CM | POA: Diagnosis not present

## 2020-05-03 DIAGNOSIS — S335XXA Sprain of ligaments of lumbar spine, initial encounter: Secondary | ICD-10-CM | POA: Diagnosis not present

## 2020-05-03 DIAGNOSIS — S233XXA Sprain of ligaments of thoracic spine, initial encounter: Secondary | ICD-10-CM | POA: Diagnosis not present

## 2020-05-03 DIAGNOSIS — S134XXA Sprain of ligaments of cervical spine, initial encounter: Secondary | ICD-10-CM | POA: Diagnosis not present

## 2020-05-10 DIAGNOSIS — S233XXA Sprain of ligaments of thoracic spine, initial encounter: Secondary | ICD-10-CM | POA: Diagnosis not present

## 2020-05-10 DIAGNOSIS — S335XXA Sprain of ligaments of lumbar spine, initial encounter: Secondary | ICD-10-CM | POA: Diagnosis not present

## 2020-05-10 DIAGNOSIS — S134XXA Sprain of ligaments of cervical spine, initial encounter: Secondary | ICD-10-CM | POA: Diagnosis not present

## 2020-05-11 ENCOUNTER — Encounter: Payer: Self-pay | Admitting: Family Medicine

## 2020-05-11 ENCOUNTER — Ambulatory Visit: Payer: BC Managed Care – PPO | Admitting: Family Medicine

## 2020-05-11 ENCOUNTER — Other Ambulatory Visit: Payer: Self-pay

## 2020-05-11 VITALS — BP 115/79 | HR 64 | Temp 97.7°F | Ht 66.0 in | Wt 236.4 lb

## 2020-05-11 DIAGNOSIS — Z8349 Family history of other endocrine, nutritional and metabolic diseases: Secondary | ICD-10-CM | POA: Diagnosis not present

## 2020-05-11 DIAGNOSIS — Z0000 Encounter for general adult medical examination without abnormal findings: Secondary | ICD-10-CM

## 2020-05-11 DIAGNOSIS — L409 Psoriasis, unspecified: Secondary | ICD-10-CM | POA: Diagnosis not present

## 2020-05-11 MED ORDER — BETAMETHASONE VALERATE 0.12 % EX FOAM: 1.0000 "application " | Freq: Two times a day (BID) | CUTANEOUS | 0 refills | Status: DC

## 2020-05-11 NOTE — Progress Notes (Signed)
Assessment & Plan:  1. Scalp psoriasis - Betamethasone Valerate 0.12 % foam; Apply 1 application topically 2 (two) times daily for 14 days.  Dispense: 100 g; Refill: 0  2. Family history of alpha 1 antitrypsin deficiency - Alpha-1-Antitrypsin  3. Healthcare maintenance Discussed pneumonia vaccine with her and that she does not have medical conditions that warrant her getting the vaccine before the age of 35.   Deliah Boston, MSN, APRN, FNP-C [facility]  Subjective:    Patient ID: Kelli May, female    DOB: December 23, 1975, 46 y.o.   MRN: [id number]  Patient Care Team: Gwenlyn Fudge, FNP as PCP - General (Family Medicine)   Chief Complaint:  Chief Complaint  Patient presents with  . Pruritis    On scalp- patient states her scalp has been itching since she had covid x 1 year ago. Patient states that it is worse. Wants to talk about getting a PNA vaccine.   . Labs Only    Alapha gal     HPI: Kelli May is a 45 y.o. female presenting on 05/11/2020 for Pruritis (On scalp- patient states her scalp has been itching since she had covid x 1 year ago. Patient states that it is worse. Wants to talk about getting a PNA vaccine. ) and Labs Only (Alapha gal )  Patient reports her scalp is been itching and she has had skin flaking out since she had COVID a year ago.  She reports this occurs all over her head but is worse and thicker at the back of her head.  She has tried OTC scalp psoriasis treatments which burned her skin.  She has tried multiple dandruff shampoos, as well as shampoos with no alcohol or paraffins, which have not been effective.  Patient would like to be tested for alpha-1 antitrypsin due to a significant family history.  She is also curious if she is able to get the pneumonia vaccine.  She reports she had COVID pneumonia and it was terrible.  Social history:  Relevant past medical, surgical, family and social history  reviewed and updated as indicated. Interim medical history since our last visit reviewed.  Allergies and medications reviewed and updated.  DATA REVIEWED: CHART IN EPIC  ROS: Negative unless specifically indicated above in HPI.    Current Outpatient Medications:  .  albuterol (PROVENTIL) (2.5 MG/3ML) 0.083% nebulizer solution, Take 3 mLs (2.5 mg total) by nebulization every 6 (six) hours as needed for wheezing or shortness of breath., Disp: 150 mL, Rfl: 1 .  aspirin EC 81 MG tablet, Take 81 mg by mouth daily. Swallow whole., Disp: , Rfl:  .  butalbital-acetaminophen-caffeine (FIORICET) 50-325-40 MG tablet, Take 1-2 tablets by mouth 2 (two) times daily as needed for headache or migraine., Disp: 14 tablet, Rfl: 5 .  cetirizine (ZYRTEC) 10 MG tablet, Take 1 tablet (10 mg total) by mouth daily., Disp: 90 tablet, Rfl: 1 .  Cholecalciferol (VITAMIN D3) 1.25 MG (50000 UT) CAPS, Take 1 tablet by mouth 2 (two) times a week., Disp: 24 capsule, Rfl: 2 .  cyclobenzaprine (FLEXERIL) 5 MG tablet, Take 1 tablet (5 mg total) by mouth 3 (three) times daily as needed for muscle spasms., Disp: 30 tablet, Rfl: 5 .  lisinopril (ZESTRIL) 5 MG tablet, Take 1 tablet (5 mg total) by mouth daily., Disp: 30 tablet, Rfl: 0 .  montelukast (SINGULAIR) 10 MG tablet, Take 1 tablet (10 mg total) by mouth at bedtime., Disp: 30 tablet, Rfl: 0 .  predniSONE (STERAPRED UNI-PAK 21 TAB) 10 MG (21) TBPK tablet, 6 tablets by mouth day 1, 5 tablets day 2, 4 tablets day 3, 3 tablet day 4, 2 tablet day 5, 1 tablet day 6., Disp: 1 each, Rfl: 0 .  promethazine (PHENERGAN) 25 MG tablet, Take 1 tablet (25 mg total) by mouth every 6 (six) hours as needed for nausea or vomiting., Disp: 30 tablet, Rfl: 2 .  rosuvastatin (CRESTOR) 10 MG tablet, Take 10 mg by mouth daily., Disp: , Rfl:    Allergies  Allergen Reactions  . Penicillins Hives   Past Medical History:  Diagnosis Date  . Allergy   . Arthritis   . Hypertension   . Vitamin D  deficiency     Past Surgical History:  Procedure Laterality Date  . ABDOMINAL HYSTERECTOMY    . BREAST REDUCTION SURGERY  2002  . TONSILLECTOMY      Social History   Socioeconomic History  . Marital status: Legally Separated    Spouse name: Not on file  . Number of children: Not on file  . Years of education: Not on file  . Highest education level: Not on file  Occupational History  . Not on file  Tobacco Use  . Smoking status: Former Smoker    Types: Cigarettes    Quit date: 2001    Years since quitting: 21.2  . Smokeless tobacco: Never Used  Vaping Use  . Vaping Use: Never used  Substance and Sexual Activity  . Alcohol use: Yes    Comment: rare  . Drug use: Never  . Sexual activity: Not Currently  Other Topics Concern  . Not on file  Social History Narrative  . Not on file   Social Determinants of Health   Financial Resource Strain: Not on file  Food Insecurity: Not on file  Transportation Needs: Not on file  Physical Activity: Not on file  Stress: Not on file  Social Connections: Not on file  Intimate Partner Violence: Not on file        Objective:    BP 115/79   Pulse 64   Temp 97.7 F (36.5 C) (Temporal)   Ht 5\' 6"  (1.676 m)   Wt 236 lb 6.4 oz (107.2 kg)   SpO2 99%   BMI 38.16 kg/m   Wt Readings from Last 3 Encounters:  05/11/20 236 lb 6.4 oz (107.2 kg)  08/15/19 229 lb 9.6 oz (104.1 kg)  10/31/18 197 lb (89.4 kg)    Physical Exam Vitals reviewed.  Constitutional:      General: She is not in acute distress.    Appearance: Normal appearance. She is not ill-appearing, toxic-appearing or diaphoretic.  HENT:     Head: Normocephalic and atraumatic.     Comments: Dandruff present as well as erythematous plaques with flaking skin at the back of her head. Eyes:     General: No scleral icterus.       Right eye: No discharge.        Left eye: No discharge.     Conjunctiva/sclera: Conjunctivae normal.  Cardiovascular:     Rate and Rhythm:  Normal rate.  Pulmonary:     Effort: Pulmonary effort is normal. No respiratory distress.  Musculoskeletal:        General: Normal range of motion.     Cervical back: Normal range of motion.  Skin:    General: Skin is warm and dry.     Capillary Refill: Capillary refill takes less than 2 seconds.  Neurological:     General: No focal deficit present.     Mental Status: She is alert and oriented to person, place, and time. Mental status is at baseline.  Psychiatric:        Mood and Affect: Mood normal.        Behavior: Behavior normal.        Thought Content: Thought content normal.        Judgment: Judgment normal.     Lab Results  Component Value Date   TSH 1.810 08/15/2019   Lab Results  Component Value Date   WBC 7.8 08/15/2019   HGB 13.6 08/15/2019   HCT 38.7 08/15/2019   MCV 84 08/15/2019   PLT 254 08/15/2019   Lab Results  Component Value Date   NA 138 08/15/2019   K 4.3 08/15/2019   CO2 22 08/15/2019   GLUCOSE 86 08/15/2019   BUN 9 08/15/2019   CREATININE 0.75 08/15/2019   BILITOT 0.5 08/15/2019   ALKPHOS 94 08/15/2019   AST 18 08/15/2019   ALT 18 08/15/2019   PROT 7.0 08/15/2019   ALBUMIN 4.2 08/15/2019   CALCIUM 9.7 08/15/2019   Lab Results  Component Value Date   CHOL 201 (H) 08/15/2019   Lab Results  Component Value Date   HDL 43 08/15/2019   Lab Results  Component Value Date   LDLCALC 133 (H) 08/15/2019   Lab Results  Component Value Date   TRIG 137 08/15/2019   Lab Results  Component Value Date   CHOLHDL 4.7 (H) 08/15/2019   No results found for: HGBA1C

## 2020-05-12 ENCOUNTER — Telehealth: Payer: Self-pay

## 2020-05-12 ENCOUNTER — Encounter: Payer: Self-pay | Admitting: Family Medicine

## 2020-05-12 DIAGNOSIS — L409 Psoriasis, unspecified: Secondary | ICD-10-CM | POA: Insufficient documentation

## 2020-05-12 LAB — ALPHA-1-ANTITRYPSIN: A-1 Antitrypsin: 122 mg/dL (ref 101–187)

## 2020-05-12 NOTE — Telephone Encounter (Signed)
They did not.

## 2020-05-12 NOTE — Telephone Encounter (Signed)
Betamethasone Valerate 0.12% foam not covered by insurance.  Would you like to start a PA or change medication?   [facility] Rx #: [id number]

## 2020-05-12 NOTE — Telephone Encounter (Signed)
Did they give a list of alternatives?

## 2020-05-13 MED ORDER — CLOBETASOL PROPIONATE 0.05 % EX SHAM: MEDICATED_SHAMPOO | CUTANEOUS | 2 refills | Status: DC

## 2020-05-13 NOTE — Addendum Note (Signed)
Addended by: Gwenlyn Fudge on: 05/13/2020 08:20 AM   Modules accepted: Orders

## 2020-05-13 NOTE — Telephone Encounter (Signed)
Patient aware.

## 2020-05-13 NOTE — Telephone Encounter (Signed)
New prescription sent

## 2020-05-24 DIAGNOSIS — S233XXA Sprain of ligaments of thoracic spine, initial encounter: Secondary | ICD-10-CM | POA: Diagnosis not present

## 2020-05-24 DIAGNOSIS — S134XXA Sprain of ligaments of cervical spine, initial encounter: Secondary | ICD-10-CM | POA: Diagnosis not present

## 2020-05-24 DIAGNOSIS — S335XXA Sprain of ligaments of lumbar spine, initial encounter: Secondary | ICD-10-CM | POA: Diagnosis not present

## 2020-05-25 ENCOUNTER — Other Ambulatory Visit: Payer: Self-pay | Admitting: Family Medicine

## 2020-05-25 DIAGNOSIS — I10 Essential (primary) hypertension: Secondary | ICD-10-CM

## 2020-05-25 DIAGNOSIS — J302 Other seasonal allergic rhinitis: Secondary | ICD-10-CM

## 2020-06-07 DIAGNOSIS — S233XXA Sprain of ligaments of thoracic spine, initial encounter: Secondary | ICD-10-CM | POA: Diagnosis not present

## 2020-06-07 DIAGNOSIS — S134XXA Sprain of ligaments of cervical spine, initial encounter: Secondary | ICD-10-CM | POA: Diagnosis not present

## 2020-06-07 DIAGNOSIS — S335XXA Sprain of ligaments of lumbar spine, initial encounter: Secondary | ICD-10-CM | POA: Diagnosis not present

## 2020-06-21 DIAGNOSIS — S233XXA Sprain of ligaments of thoracic spine, initial encounter: Secondary | ICD-10-CM | POA: Diagnosis not present

## 2020-06-21 DIAGNOSIS — S134XXA Sprain of ligaments of cervical spine, initial encounter: Secondary | ICD-10-CM | POA: Diagnosis not present

## 2020-06-21 DIAGNOSIS — S335XXA Sprain of ligaments of lumbar spine, initial encounter: Secondary | ICD-10-CM | POA: Diagnosis not present

## 2020-07-05 ENCOUNTER — Encounter: Payer: Self-pay | Admitting: Family Medicine

## 2020-07-05 DIAGNOSIS — S233XXA Sprain of ligaments of thoracic spine, initial encounter: Secondary | ICD-10-CM | POA: Diagnosis not present

## 2020-07-05 DIAGNOSIS — S335XXA Sprain of ligaments of lumbar spine, initial encounter: Secondary | ICD-10-CM | POA: Diagnosis not present

## 2020-07-05 DIAGNOSIS — S134XXA Sprain of ligaments of cervical spine, initial encounter: Secondary | ICD-10-CM | POA: Diagnosis not present

## 2020-07-19 DIAGNOSIS — S233XXA Sprain of ligaments of thoracic spine, initial encounter: Secondary | ICD-10-CM | POA: Diagnosis not present

## 2020-07-19 DIAGNOSIS — S134XXA Sprain of ligaments of cervical spine, initial encounter: Secondary | ICD-10-CM | POA: Diagnosis not present

## 2020-07-19 DIAGNOSIS — S335XXA Sprain of ligaments of lumbar spine, initial encounter: Secondary | ICD-10-CM | POA: Diagnosis not present

## 2020-07-26 DIAGNOSIS — S134XXA Sprain of ligaments of cervical spine, initial encounter: Secondary | ICD-10-CM | POA: Diagnosis not present

## 2020-07-26 DIAGNOSIS — S233XXA Sprain of ligaments of thoracic spine, initial encounter: Secondary | ICD-10-CM | POA: Diagnosis not present

## 2020-07-26 DIAGNOSIS — S335XXA Sprain of ligaments of lumbar spine, initial encounter: Secondary | ICD-10-CM | POA: Diagnosis not present

## 2020-08-04 DIAGNOSIS — S335XXA Sprain of ligaments of lumbar spine, initial encounter: Secondary | ICD-10-CM | POA: Diagnosis not present

## 2020-08-04 DIAGNOSIS — S233XXA Sprain of ligaments of thoracic spine, initial encounter: Secondary | ICD-10-CM | POA: Diagnosis not present

## 2020-08-04 DIAGNOSIS — S134XXA Sprain of ligaments of cervical spine, initial encounter: Secondary | ICD-10-CM | POA: Diagnosis not present

## 2020-08-09 DIAGNOSIS — S335XXA Sprain of ligaments of lumbar spine, initial encounter: Secondary | ICD-10-CM | POA: Diagnosis not present

## 2020-08-09 DIAGNOSIS — S233XXA Sprain of ligaments of thoracic spine, initial encounter: Secondary | ICD-10-CM | POA: Diagnosis not present

## 2020-08-09 DIAGNOSIS — S134XXA Sprain of ligaments of cervical spine, initial encounter: Secondary | ICD-10-CM | POA: Diagnosis not present

## 2020-08-23 DIAGNOSIS — S233XXA Sprain of ligaments of thoracic spine, initial encounter: Secondary | ICD-10-CM | POA: Diagnosis not present

## 2020-08-23 DIAGNOSIS — S134XXA Sprain of ligaments of cervical spine, initial encounter: Secondary | ICD-10-CM | POA: Diagnosis not present

## 2020-08-23 DIAGNOSIS — S335XXA Sprain of ligaments of lumbar spine, initial encounter: Secondary | ICD-10-CM | POA: Diagnosis not present

## 2020-08-29 ENCOUNTER — Other Ambulatory Visit: Payer: Self-pay | Admitting: Family Medicine

## 2020-08-29 DIAGNOSIS — I10 Essential (primary) hypertension: Secondary | ICD-10-CM

## 2020-08-30 DIAGNOSIS — S233XXA Sprain of ligaments of thoracic spine, initial encounter: Secondary | ICD-10-CM | POA: Diagnosis not present

## 2020-08-30 DIAGNOSIS — S335XXA Sprain of ligaments of lumbar spine, initial encounter: Secondary | ICD-10-CM | POA: Diagnosis not present

## 2020-08-30 DIAGNOSIS — S134XXA Sprain of ligaments of cervical spine, initial encounter: Secondary | ICD-10-CM | POA: Diagnosis not present

## 2020-09-06 DIAGNOSIS — S233XXA Sprain of ligaments of thoracic spine, initial encounter: Secondary | ICD-10-CM | POA: Diagnosis not present

## 2020-09-06 DIAGNOSIS — S335XXA Sprain of ligaments of lumbar spine, initial encounter: Secondary | ICD-10-CM | POA: Diagnosis not present

## 2020-09-06 DIAGNOSIS — S134XXA Sprain of ligaments of cervical spine, initial encounter: Secondary | ICD-10-CM | POA: Diagnosis not present

## 2020-09-15 ENCOUNTER — Other Ambulatory Visit: Payer: Self-pay | Admitting: Family Medicine

## 2020-09-15 DIAGNOSIS — I10 Essential (primary) hypertension: Secondary | ICD-10-CM

## 2020-09-15 NOTE — Telephone Encounter (Signed)
Britney NTBS 30 days given 08/30/20

## 2020-09-20 DIAGNOSIS — S134XXA Sprain of ligaments of cervical spine, initial encounter: Secondary | ICD-10-CM | POA: Diagnosis not present

## 2020-09-20 DIAGNOSIS — S335XXA Sprain of ligaments of lumbar spine, initial encounter: Secondary | ICD-10-CM | POA: Diagnosis not present

## 2020-09-20 DIAGNOSIS — S233XXA Sprain of ligaments of thoracic spine, initial encounter: Secondary | ICD-10-CM | POA: Diagnosis not present

## 2020-09-22 ENCOUNTER — Ambulatory Visit: Payer: BC Managed Care – PPO | Admitting: Family Medicine

## 2020-09-27 DIAGNOSIS — S233XXA Sprain of ligaments of thoracic spine, initial encounter: Secondary | ICD-10-CM | POA: Diagnosis not present

## 2020-09-27 DIAGNOSIS — S335XXA Sprain of ligaments of lumbar spine, initial encounter: Secondary | ICD-10-CM | POA: Diagnosis not present

## 2020-09-27 DIAGNOSIS — S134XXA Sprain of ligaments of cervical spine, initial encounter: Secondary | ICD-10-CM | POA: Diagnosis not present

## 2020-10-01 ENCOUNTER — Ambulatory Visit: Payer: BC Managed Care – PPO | Admitting: Family Medicine

## 2020-10-04 DIAGNOSIS — S335XXA Sprain of ligaments of lumbar spine, initial encounter: Secondary | ICD-10-CM | POA: Diagnosis not present

## 2020-10-04 DIAGNOSIS — S134XXA Sprain of ligaments of cervical spine, initial encounter: Secondary | ICD-10-CM | POA: Diagnosis not present

## 2020-10-04 DIAGNOSIS — S233XXA Sprain of ligaments of thoracic spine, initial encounter: Secondary | ICD-10-CM | POA: Diagnosis not present

## 2020-10-11 ENCOUNTER — Other Ambulatory Visit: Payer: Self-pay | Admitting: Family Medicine

## 2020-10-11 DIAGNOSIS — L409 Psoriasis, unspecified: Secondary | ICD-10-CM

## 2020-10-11 DIAGNOSIS — S335XXA Sprain of ligaments of lumbar spine, initial encounter: Secondary | ICD-10-CM | POA: Diagnosis not present

## 2020-10-11 DIAGNOSIS — S233XXA Sprain of ligaments of thoracic spine, initial encounter: Secondary | ICD-10-CM | POA: Diagnosis not present

## 2020-10-11 DIAGNOSIS — S134XXA Sprain of ligaments of cervical spine, initial encounter: Secondary | ICD-10-CM | POA: Diagnosis not present

## 2020-11-18 ENCOUNTER — Encounter: Payer: Self-pay | Admitting: Family Medicine

## 2020-11-18 ENCOUNTER — Ambulatory Visit (INDEPENDENT_AMBULATORY_CARE_PROVIDER_SITE_OTHER): Payer: BC Managed Care – PPO | Admitting: Family Medicine

## 2020-11-18 ENCOUNTER — Other Ambulatory Visit: Payer: Self-pay

## 2020-11-18 VITALS — BP 119/80 | HR 71 | Temp 97.6°F | Ht 66.0 in | Wt 247.4 lb

## 2020-11-18 DIAGNOSIS — Z0001 Encounter for general adult medical examination with abnormal findings: Secondary | ICD-10-CM | POA: Diagnosis not present

## 2020-11-18 DIAGNOSIS — Z0000 Encounter for general adult medical examination without abnormal findings: Secondary | ICD-10-CM

## 2020-11-18 DIAGNOSIS — J302 Other seasonal allergic rhinitis: Secondary | ICD-10-CM

## 2020-11-18 DIAGNOSIS — E559 Vitamin D deficiency, unspecified: Secondary | ICD-10-CM

## 2020-11-18 DIAGNOSIS — L409 Psoriasis, unspecified: Secondary | ICD-10-CM | POA: Diagnosis not present

## 2020-11-18 DIAGNOSIS — I10 Essential (primary) hypertension: Secondary | ICD-10-CM

## 2020-11-18 DIAGNOSIS — R0609 Other forms of dyspnea: Secondary | ICD-10-CM

## 2020-11-18 MED ORDER — MONTELUKAST SODIUM 10 MG PO TABS: ORAL_TABLET | ORAL | 3 refills | Status: DC

## 2020-11-18 MED ORDER — LISINOPRIL 5 MG PO TABS: 5.0000 mg | ORAL_TABLET | Freq: Every day | ORAL | 3 refills | Status: DC

## 2020-11-18 NOTE — Progress Notes (Signed)
Assessment & Plan:  1. Well adult exam - preventative health information provided - patient to get established with dentist in the area - waiting on records to see if patient needs to be doing colonoscopies due to mom and grandmother having precancerous polyps - CBC with Differential/Platelet - CMP14+EGFR - Lipid panel  2. Essential hypertension - Well controlled on current regimen - encouraged healthy diet and exercise - Patient to see nutritionist in [place] for assistance with meal planning - lisinopril (ZESTRIL) 5 MG tablet; Take 1 tablet (5 mg total) by mouth daily.  Dispense: 90 tablet; Refill: 3 - CBC with Differential/Platelet - CMP14+EGFR - Lipid panel  3. Seasonal allergies - take zyrtec as needed - montelukast (SINGULAIR) 10 MG tablet; TAKE 1 TABLET BY MOUTH EVERYDAY AT BEDTIME  Dispense: 90 tablet; Refill: 3 - CMP14+EGFR  4. Scalp psoriasis - encouraged to use zyrtec and singular and see if there is an improvement in her symptoms - offered dematology referral if symptoms fail to improve  5. Vitamin D deficiency - continue vitamin D supplement  6. Dyspnea on exertion - encouraged to use albuterol inhaler as needed for shortness of breath - Ambulatory referral to Pulmonology   Follow-up: Return in about 1 year (around 11/18/2021) for annual physical.   Lucile Crater, NP Student  I personally was present during the history, physical exam, and medical decision-making activities of this service and have verified that the service and findings are accurately documented in the nurse practitioner student's note.  Hendricks Limes, MSN, APRN, FNP-C [facility]   Subjective:  Patient ID: Kelli May, female    DOB: [birth year]  Age: 45 y.o. MRN: [id number]  Patient Care Team: Loman Brooklyn, FNP as PCP - General (Family Medicine)   CC:  Chief Complaint  Patient presents with   Annual Exam   scalp psoriasis    States now it is  painful     HPI Gary Bultman presents for annual physical.   Occupation: employed, Marital status: divorced, Substance use: none Diet: regular, needs assistance with portion control, Exercise: minimal Last eye exam: this year at [facility] in [place] Last dental exam: >2 years, while she lived in [place], would like a recommendation Last mammogram: 4 years ago, would like to schedule here Colonoscopy: she had one 5 years ago on the recommendation of her mother's GI specialist due to detection of precancerous polyps. Hepatitis C Screening: declined Immunizations: Flu Vaccine:  will get at work Tdap Vaccine:  states she has had within last 10 years, do not have record   COVID-19 Vaccine: declined   DEPRESSION SCREENING PHQ 2/9 Scores 11/18/2020 08/15/2019  PHQ - 2 Score 1 0  PHQ- 9 Score 9 -   Hypertension: She is well controlled on lisinopril 5 mg.  Diet: She is interested in seeing a nutritionist for assistance with meal plans and assistance with weight loss. She is unsure that her insurance will cover this.   Psoriasis: She has previously been seen for psoriasis and was prescribed clobetasol propionate shampoo. She states it helps some with itching but she still has scattered lesions on the back of her head/neck. She does not take her allergy medications daily, only seasonally, but in the past she has taken them daily. She states that she does not believe she had issues with psoriasis when she took Zyrtec and Singulair daily. She is willing to see a dermatologist.  Shortness of breath: She reports that she had COVID  2 years ago with bilateral pneumonia, and since this she has had ongoing shortness of breath with exercise and activity. She has trouble wearing masks for long periods of time, and finds that she has to stop and take very deep breaths. She has not been using her albuterol inhaler that was prescribed while she had active COVID, because she thought it could  only be used for COVID. She states this is sometimes exacerbated by allergies. She started taking her allergy medicines this week.   Review of Systems  Constitutional: Negative.  Negative for chills, fever, malaise/fatigue and weight loss.  HENT:  Positive for congestion. Negative for ear discharge, ear pain, hearing loss and sinus pain.   Eyes: Negative.  Negative for blurred vision, pain, discharge and redness.  Respiratory:  Positive for shortness of breath (with exercise or prolonged mask exposure). Negative for cough and wheezing.   Cardiovascular: Negative.  Negative for chest pain, palpitations, orthopnea and leg swelling.  Gastrointestinal: Negative.  Negative for abdominal pain, constipation, diarrhea, heartburn, nausea and vomiting.  Genitourinary: Negative.  Negative for dysuria, frequency and urgency.  Musculoskeletal: Negative.  Negative for back pain, falls, joint pain, myalgias and neck pain.  Skin:  Positive for itching (back of head). Negative for rash.  Neurological: Negative.  Negative for dizziness, tremors, weakness and headaches.  Endo/Heme/Allergies: Negative.  Negative for environmental allergies and polydipsia. Does not bruise/bleed easily.  Psychiatric/Behavioral: Negative.  Negative for depression, memory loss and substance abuse. The patient is not nervous/anxious and does not have insomnia.     Current Outpatient Medications:    aspirin EC 81 MG tablet, Take 81 mg by mouth daily. Swallow whole., Disp: , Rfl:    butalbital-acetaminophen-caffeine (FIORICET) 50-325-40 MG tablet, Take 1-2 tablets by mouth 2 (two) times daily as needed for headache or migraine., Disp: 14 tablet, Rfl: 5   cetirizine (ZYRTEC) 10 MG tablet, Take 1 tablet (10 mg total) by mouth daily., Disp: 90 tablet, Rfl: 1   Cholecalciferol (VITAMIN D3) 1.25 MG (50000 UT) CAPS, Take 1 tablet by mouth 2 (two) times a week., Disp: 24 capsule, Rfl: 2   Clobetasol Propionate 0.05 % shampoo, APPLY THIN FILM  TOPICALLY TO DRY SCALP ONCE DAILY- LEAVE ON 15 MINUTES BEFORE LATHERING AND RINSING, Disp: 118 mL, Rfl: 2   cyclobenzaprine (FLEXERIL) 5 MG tablet, Take 1 tablet (5 mg total) by mouth 3 (three) times daily as needed for muscle spasms., Disp: 30 tablet, Rfl: 5   promethazine (PHENERGAN) 25 MG tablet, Take 1 tablet (25 mg total) by mouth every 6 (six) hours as needed for nausea or vomiting., Disp: 30 tablet, Rfl: 2   rosuvastatin (CRESTOR) 10 MG tablet, Take 10 mg by mouth daily., Disp: , Rfl:    lisinopril (ZESTRIL) 5 MG tablet, Take 1 tablet (5 mg total) by mouth daily., Disp: 90 tablet, Rfl: 3   montelukast (SINGULAIR) 10 MG tablet, TAKE 1 TABLET BY MOUTH EVERYDAY AT BEDTIME, Disp: 90 tablet, Rfl: 3  Allergies  Allergen Reactions   Penicillins Hives    Past Medical History:  Diagnosis Date   Allergy    Arthritis    Hypertension    Vitamin D deficiency     Past Surgical History:  Procedure Laterality Date   BREAST REDUCTION SURGERY  2002   TONSILLECTOMY     TOTAL ABDOMINAL HYSTERECTOMY      Family History  Problem Relation Age of Onset   Bipolar disorder Mother    Depression Mother    Hyperlipidemia  Father    Hypertension Father    Depression Father    Epilepsy Daughter    ADD / ADHD Daughter    AAA (abdominal aortic aneurysm) Daughter    ADD / ADHD Son    Allergies Son    Diabetes Maternal Grandfather    Cancer Paternal Grandmother    Heart disease Paternal Grandfather     Social History   Socioeconomic History   Marital status: Legally Separated    Spouse name: Not on file   Number of children: Not on file   Years of education: Not on file   Highest education level: Not on file  Occupational History   Not on file  Tobacco Use   Smoking status: Former    Types: Cigarettes    Quit date: 2001    Years since quitting: 21.7   Smokeless tobacco: Never  Vaping Use   Vaping Use: Never used  Substance and Sexual Activity   Alcohol use: Yes    Comment: rare    Drug use: Never   Sexual activity: Not Currently  Other Topics Concern   Not on file  Social History Narrative   Not on file   Social Determinants of Health   Financial Resource Strain: Not on file  Food Insecurity: Not on file  Transportation Needs: Not on file  Physical Activity: Not on file  Stress: Not on file  Social Connections: Not on file  Intimate Partner Violence: Not on file      Objective:    BP 119/80   Pulse 71   Temp 97.6 F (36.4 C) (Temporal)   Ht _0  (1.676 m)   Wt 112.2 kg   SpO2 96%   BMI 39.93 kg/m   Wt Readings from Last 3 Encounters:  11/18/20 247 lb 6.4 oz (112.2 kg)  05/11/20 236 lb 6.4 oz (107.2 kg)  08/15/19 229 lb 9.6 oz (104.1 kg)    Physical Exam Vitals reviewed.  Constitutional:      General: She is not in acute distress.    Appearance: Normal appearance. She is obese. She is not ill-appearing, toxic-appearing or diaphoretic.  HENT:     Head: Normocephalic and atraumatic.     Right Ear: Tympanic membrane, ear canal and external ear normal.     Left Ear: Tympanic membrane, ear canal and external ear normal.     Nose: No congestion (erythema).     Mouth/Throat:     Mouth: Mucous membranes are moist.     Pharynx: Oropharynx is clear. No oropharyngeal exudate or posterior oropharyngeal erythema.  Eyes:     Extraocular Movements: Extraocular movements intact.     Conjunctiva/sclera: Conjunctivae normal.     Pupils: Pupils are equal, round, and reactive to light.  Cardiovascular:     Rate and Rhythm: Normal rate and regular rhythm.     Pulses: Normal pulses.     Heart sounds: Normal heart sounds.  Pulmonary:     Effort: Pulmonary effort is normal. No respiratory distress.     Breath sounds: Normal breath sounds. No wheezing or rhonchi.  Abdominal:     General: Bowel sounds are normal. There is no distension.     Palpations: Abdomen is soft. There is no mass.  Musculoskeletal:        General: Normal range of motion.      Cervical back: Normal range of motion.  Skin:    General: Skin is warm and dry.     Capillary Refill: Capillary refill  takes less than 2 seconds.     Comments: Several small lesions on the back of her head consistent with psoriasis  Neurological:     General: No focal deficit present.     Mental Status: She is alert and oriented to person, place, and time.     Motor: No weakness.     Gait: Gait normal.  Psychiatric:        Mood and Affect: Mood normal.        Behavior: Behavior normal.        Thought Content: Thought content normal.        Judgment: Judgment normal.    Lab Results  Component Value Date   TSH 1.810 08/15/2019   Lab Results  Component Value Date   WBC 7.8 08/15/2019   HGB 13.6 08/15/2019   HCT 38.7 08/15/2019   MCV 84 08/15/2019   PLT 254 08/15/2019   Lab Results  Component Value Date   NA 138 08/15/2019   K 4.3 08/15/2019   CO2 22 08/15/2019   GLUCOSE 86 08/15/2019   BUN 9 08/15/2019   CREATININE 0.75 08/15/2019   BILITOT 0.5 08/15/2019   ALKPHOS 94 08/15/2019   AST 18 08/15/2019   ALT 18 08/15/2019   PROT 7.0 08/15/2019   ALBUMIN 4.2 08/15/2019   CALCIUM 9.7 08/15/2019   Lab Results  Component Value Date   CHOL 201 (H) 08/15/2019   Lab Results  Component Value Date   HDL 43 08/15/2019   Lab Results  Component Value Date   LDLCALC 133 (H) 08/15/2019   Lab Results  Component Value Date   TRIG 137 08/15/2019   Lab Results  Component Value Date   CHOLHDL 4.7 (H) 08/15/2019   No results found for: HGBA1C

## 2020-11-23 ENCOUNTER — Encounter: Payer: Self-pay | Admitting: Family Medicine

## 2020-11-23 ENCOUNTER — Telehealth: Payer: Self-pay

## 2020-11-23 NOTE — Telephone Encounter (Signed)
Lab aware and is looking into it and will let provider know

## 2020-11-23 NOTE — Telephone Encounter (Signed)
-----   Message from Gwenlyn Fudge, FNP sent at 11/23/2020  7:51 AM EDT ----- Patient had labs completed last week and they have not resulted. Can we please check in to this?  Britney ----- Message ----- From: SYSTEM Sent: 11/23/2020  12:16 AM EDT To: Gwenlyn Fudge, FNP

## 2021-01-07 ENCOUNTER — Other Ambulatory Visit: Payer: Self-pay | Admitting: Family Medicine

## 2021-01-07 DIAGNOSIS — R519 Headache, unspecified: Secondary | ICD-10-CM

## 2021-01-07 DIAGNOSIS — L409 Psoriasis, unspecified: Secondary | ICD-10-CM

## 2021-01-10 NOTE — Telephone Encounter (Signed)
Kelli May patient  Last office visit 11/18/2020  Last refill: Fioricet 08/15/19, #14, 5 refills Promethazine 08/15/19, #30, 2 refills Clobetasol ointment 118 ml/ 2 refills, 10/12/20

## 2021-01-18 ENCOUNTER — Encounter: Payer: Self-pay | Admitting: Family Medicine

## 2021-01-20 MED ORDER — CYCLOBENZAPRINE HCL 5 MG PO TABS: 5.0000 mg | ORAL_TABLET | Freq: Three times a day (TID) | ORAL | 5 refills | Status: DC | PRN

## 2021-01-26 ENCOUNTER — Other Ambulatory Visit: Payer: Self-pay

## 2021-01-26 ENCOUNTER — Encounter: Payer: Self-pay | Admitting: Internal Medicine

## 2021-01-26 ENCOUNTER — Ambulatory Visit: Payer: BC Managed Care – PPO | Admitting: Internal Medicine

## 2021-01-26 VITALS — BP 128/80 | HR 84 | Temp 98.4°F | Ht 67.0 in | Wt 235.0 lb

## 2021-01-26 DIAGNOSIS — R0609 Other forms of dyspnea: Secondary | ICD-10-CM | POA: Diagnosis not present

## 2021-01-26 DIAGNOSIS — I10 Essential (primary) hypertension: Secondary | ICD-10-CM

## 2021-01-26 MED ORDER — LOSARTAN POTASSIUM 50 MG PO TABS: 50.0000 mg | ORAL_TABLET | Freq: Once | ORAL | Status: DC

## 2021-01-26 NOTE — Patient Instructions (Signed)
Stop lisinopril and start losartan 50 mg one daily.  To get the most out of exercise, you need to be continuously aware that you are short of breath, but never out of breath, for at least 30 minutes daily. As you improve, it will actually be easier for you to do the same amount of exercise  in  30 minutes so always push to the level where you are short of breath.  Once you can do this, push for longer duration or repeat it after at least 4 hours of rest.  Make sure you check your oxygen saturations at highest level of activity    Please schedule a follow up office visit in 6 weeks, call sooner if needed

## 2021-01-26 NOTE — Progress Notes (Signed)
Kelli May, female    DOB: 1975/04/16,   MRN: [id number]   Brief patient profile:  45  yowf quit smoking 2021 with h/o seasonal rhinitis/urge to clear throat onset 2004 referred to pulmonary clinic in [place]  01/26/2021 by Deliah Boston  for ? Post covid syndrome from "bilateral pneumonia" Dec 2020  with clear cxr 08/15/19 but persitent dry cough (not seasonal) and sob at rest and with heavy exertion (but not in between)   Baseline able jog 5 k  in 44 min  @ 218 last  did this in fall of 2020  @ 13 min mile avg on montelukast and zyrtec seemed to help  damper the seasonal symptoms but not helping her at time of initial ov    History of Present Illness  01/26/2021  Pulmonary/ 1st office eval/ Arvie Villarruel / [place] [facility] on ACEi / s/p Dec 2021 covid on lisinopril 2016  Chief Complaint  Patient presents with   Consult    Covid + 01/2018 had bilateral pneumonia. SOB since then when walking and talking.   Dyspnea: 15 min power walk then had to quit when lost breath, worse with talking either during ex or at rest  but not at all proportionate to aerobic activity  Cough: no / just throat clearing  Sleep: no resp problem  SABA use: not using   No obvious day to day or daytime variability or assoc excess/ purulent sputum or mucus plugs or hemoptysis or cp or chest tightness, subjective wheeze or overt sinus or hb symptoms.   Sleeping flat  without nocturnal  or early am exacerbation  of respiratory  c/o's or need for noct saba. Also denies any obvious fluctuation of symptoms with weather or environmental changes or other aggravating or alleviating factors except as outlined above   No unusual exposure hx or h/o childhood pna/ asthma or knowledge of premature birth.  Current Allergies, Complete Past Medical History, Past Surgical History, Family History, and Social History were reviewed in [employer] record.  ROS  The following are not active complaints unless  bolded Hoarseness, sore throat, dysphagia, dental problems, itching, sneezing,  nasal congestion or discharge of excess mucus or purulent secretions, ear ache,   fever, chills, sweats, unintended wt loss or wt gain, classically pleuritic or exertional cp,  orthopnea pnd or arm/hand swelling  or leg swelling, presyncope, palpitations, abdominal pain, anorexia, nausea, vomiting, diarrhea  or change in bowel habits or change in bladder habits, change in stools or change in urine, dysuria, hematuria,  rash, arthralgias, visual complaints, headache, numbness, weakness or ataxia or problems with walking or coordination,  change in mood or  memory.           Past Medical History:  Diagnosis Date   Allergy    Arthritis    Hypertension    Vitamin D deficiency     Outpatient Medications Prior to Visit  Medication Sig Dispense Refill   albuterol (VENTOLIN HFA) 108 (90 Base) MCG/ACT inhaler Inhale 2 puffs into the lungs every 6 (six) hours as needed.     aspirin EC 81 MG tablet Take 81 mg by mouth daily. Swallow whole.     butalbital-acetaminophen-caffeine (FIORICET) 50-325-40 MG tablet Take 1-2 tablets by mouth 2 (two) times daily as needed for headache or migraine. 14 tablet 5   cetirizine (ZYRTEC) 10 MG tablet Take 1 tablet (10 mg total) by mouth daily. 90 tablet 1   Cholecalciferol (VITAMIN D3) 1.25 MG (50000 UT) CAPS  Take 1 tablet by mouth 2 (two) times a week. 24 capsule 2   Clobetasol Propionate 0.05 % shampoo APPLY THIN FILM TOPICALLY TO DRY SCALP ONCE DAILY- LEAVE ON 15 MINUTES BEFORE LATHERING AND RINSING 118 mL 2   cyclobenzaprine (FLEXERIL) 5 MG tablet Take 1 tablet (5 mg total) by mouth 3 (three) times daily as needed for muscle spasms. 30 tablet 5   lisinopril (ZESTRIL) 5 MG tablet Take 1 tablet (5 mg total) by mouth daily. 90 tablet 3   montelukast (SINGULAIR) 10 MG tablet TAKE 1 TABLET BY MOUTH EVERYDAY AT BEDTIME 90 tablet 3   promethazine (PHENERGAN) 25 MG tablet TAKE 1 TABLET (25 MG  TOTAL) BY MOUTH EVERY 6 (SIX) HOURS AS NEEDED FOR NAUSEA OR VOMITING. 30 tablet 2   rosuvastatin (CRESTOR) 10 MG tablet Take 10 mg by mouth daily.     No facility-administered medications prior to visit.     Objective:     BP 128/80 (BP Location: Left Arm, Patient Position: Sitting)    Pulse 84    Temp 98.4 F (36.9 C)    Ht 5\' 7"  (1.702 m)    Wt 235 lb 0.6 oz (106.6 kg)    SpO2 98% Comment: ra   BMI 36.81 kg/m   SpO2: 98 % (ra)  Amb verbose pleasant wf with voice fatigue and throat clearing, minimal pseudowheeze   HEENT : pt wearing mask not removed for exam due to covid -19 concerns.    NECK :  without JVD/Nodes/TM/ nl carotid upstrokes bilaterally   LUNGS: no acc muscle use,  Nl contour chest which is clear to A and P bilaterally without cough on insp or exp maneuvers   CV:  RRR  no s3 or murmur or increase in P2, and no edema   ABD:  soft and nontender with nl inspiratory excursion in the supine position. No bruits or organomegaly appreciated, bowel sounds nl  MS:  Nl gait/ ext warm without deformities, calf tenderness, cyanosis or clubbing No obvious joint restrictions   SKIN: warm and dry without lesions    NEURO:  alert, approp, nl sensorium with  no motor or cerebellar deficits apparent.     I personally reviewed images and agree with radiology impression as follows:  CXR:   7/2//2022  Wnl       Assessment   DOE (dyspnea on exertion) Onset Dec 2021 wit covid but nl cxr 08/14/20  -  01/26/2021   Walked on RA   x  3  lap(s) =  approx 450  ft  @ fast pace, stopped due to end of study c/o sob all 3 with lowest 02 sats 96%   -  01/26/2021 try off ACEi   Symptoms are markedly disproportionate to objective findings and not clear to what extent this is actually a pulmonary  problem but pt does appear to have difficult to sort out respiratory symptoms of unknown origin for which  DDX  = almost all start with A and  include Adherence, Ace Inhibitors, Acid Reflux,  Active Sinus Disease, Alpha 1 Antitripsin deficiency, Anxiety masquerading as Airways dz,  ABPA,  Allergy(esp in young), Aspiration (esp in elderly), Adverse effects of meds,  Active smoking or Vaping, A bunch of PE's/clot burden (a few small clots can't cause this syndrome unless there is already severe underlying pulm or vascular dz with poor reserve),  Anemia or thyroid disorder, plus two Bs  = Bronchiectasis and Beta blocker use..and one C= CHF  ACEi adverse effects at the  top of the usual list of suspects and the only way to rule it out is a trial off > see a/p    ? Acid (or non-acid) GERD > always difficult to exclude as up to 75% of pts in some series report no assoc GI/ Heartburn symptoms >>> if not improving off acei  rec max (24h)  acid suppression and diet restrictions  instructions given in writing.   ? Allergy :  Previously had seasonal pattern now sense of pnds is constant daytime typical of acei > allergy but keep this in ddx  ? ? Anxiety/depression/ deconditioning  > usually at the bottom of this list of usual suspects but  may interfere with adherence and also interpretation of response or lack thereof to symptom management which can be quite subjective.    ? A bunch of PEs would not explain sob with speaking and disproportionate to ex intensity    >>> regroup in 6 weeks    Each maintenance medication was reviewed in detail including emphasizing most importantly the difference between maintenance and prns and under what circumstances the prns are to be triggered using an action plan format where appropriate.  Total time for H and P, chart review, counseling,  directly observing portions of ambulatory 02 saturation study/ and generating customized AVS unique to this office visit / same day charting  > 45 min                     Christinia Gully, MD 01/26/2021

## 2021-01-26 NOTE — Assessment & Plan Note (Signed)
Onset Dec 2021 wit covid but nl cxr 08/14/20  -  01/26/2021   Walked on RA   x  3  lap(s) =  approx 450  ft  @ fast pace, stopped due to end of study c/o sob all 3 with lowest 02 sats 96%   -  01/26/2021 try off ACEi   Symptoms are markedly disproportionate to objective findings and not clear to what extent this is actually a pulmonary  problem but pt does appear to have difficult to sort out respiratory symptoms of unknown origin for which  DDX  = almost all start with A and  include Adherence, Ace Inhibitors, Acid Reflux, Active Sinus Disease, Alpha 1 Antitripsin deficiency, Anxiety masquerading as Airways dz,  ABPA,  Allergy(esp in young), Aspiration (esp in elderly), Adverse effects of meds,  Active smoking or Vaping, A bunch of PE's/clot burden (a few small clots can't cause this syndrome unless there is already severe underlying pulm or vascular dz with poor reserve),  Anemia or thyroid disorder, plus two Bs  = Bronchiectasis and Beta blocker use..and one C= CHF     ACEi adverse effects at the  top of the usual list of suspects and the only way to rule it out is a trial off > see a/p    ? Acid (or non-acid) GERD > always difficult to exclude as up to 75% of pts in some series report no assoc GI/ Heartburn symptoms >>> if not improving off acei  rec max (24h)  acid suppression and diet restrictions  instructions given in writing.   ? Allergy :  Previously had seasonal pattern now sense of pnds is constant daytime typical of acei > allergy but keep this in ddx  ? ? Anxiety/depression/ deconditioning  > usually at the bottom of this list of usual suspects but  may interfere with adherence and also interpretation of response or lack thereof to symptom management which can be quite subjective.    ? A bunch of PEs would not explain sob with speaking and disproportionate to ex intensity    >>> regroup in 6 weeks    Each maintenance medication was reviewed in detail including emphasizing most  importantly the difference between maintenance and prns and under what circumstances the prns are to be triggered using an action plan format where appropriate.  Total time for H and P, chart review, counseling,  directly observing portions of ambulatory 02 saturation study/ and generating customized AVS unique to this office visit / same day charting  > 45 min

## 2021-02-07 ENCOUNTER — Encounter: Payer: Self-pay | Admitting: Family Medicine

## 2021-02-08 ENCOUNTER — Encounter: Payer: Self-pay | Admitting: Internal Medicine

## 2021-02-09 MED ORDER — LOSARTAN POTASSIUM 50 MG PO TABS
50.0000 mg | ORAL_TABLET | Freq: Every day | ORAL | 1 refills | Status: DC
Start: 2021-02-09 — End: 2021-03-07

## 2021-02-10 IMAGING — DX DG CHEST 2V
2 series · 2 of 2 positions shown · non-contrast
Comparison: None.

CLINICAL DATA: Shortness of breath

EXAM:
CHEST - 2 VIEW

[chest pa]
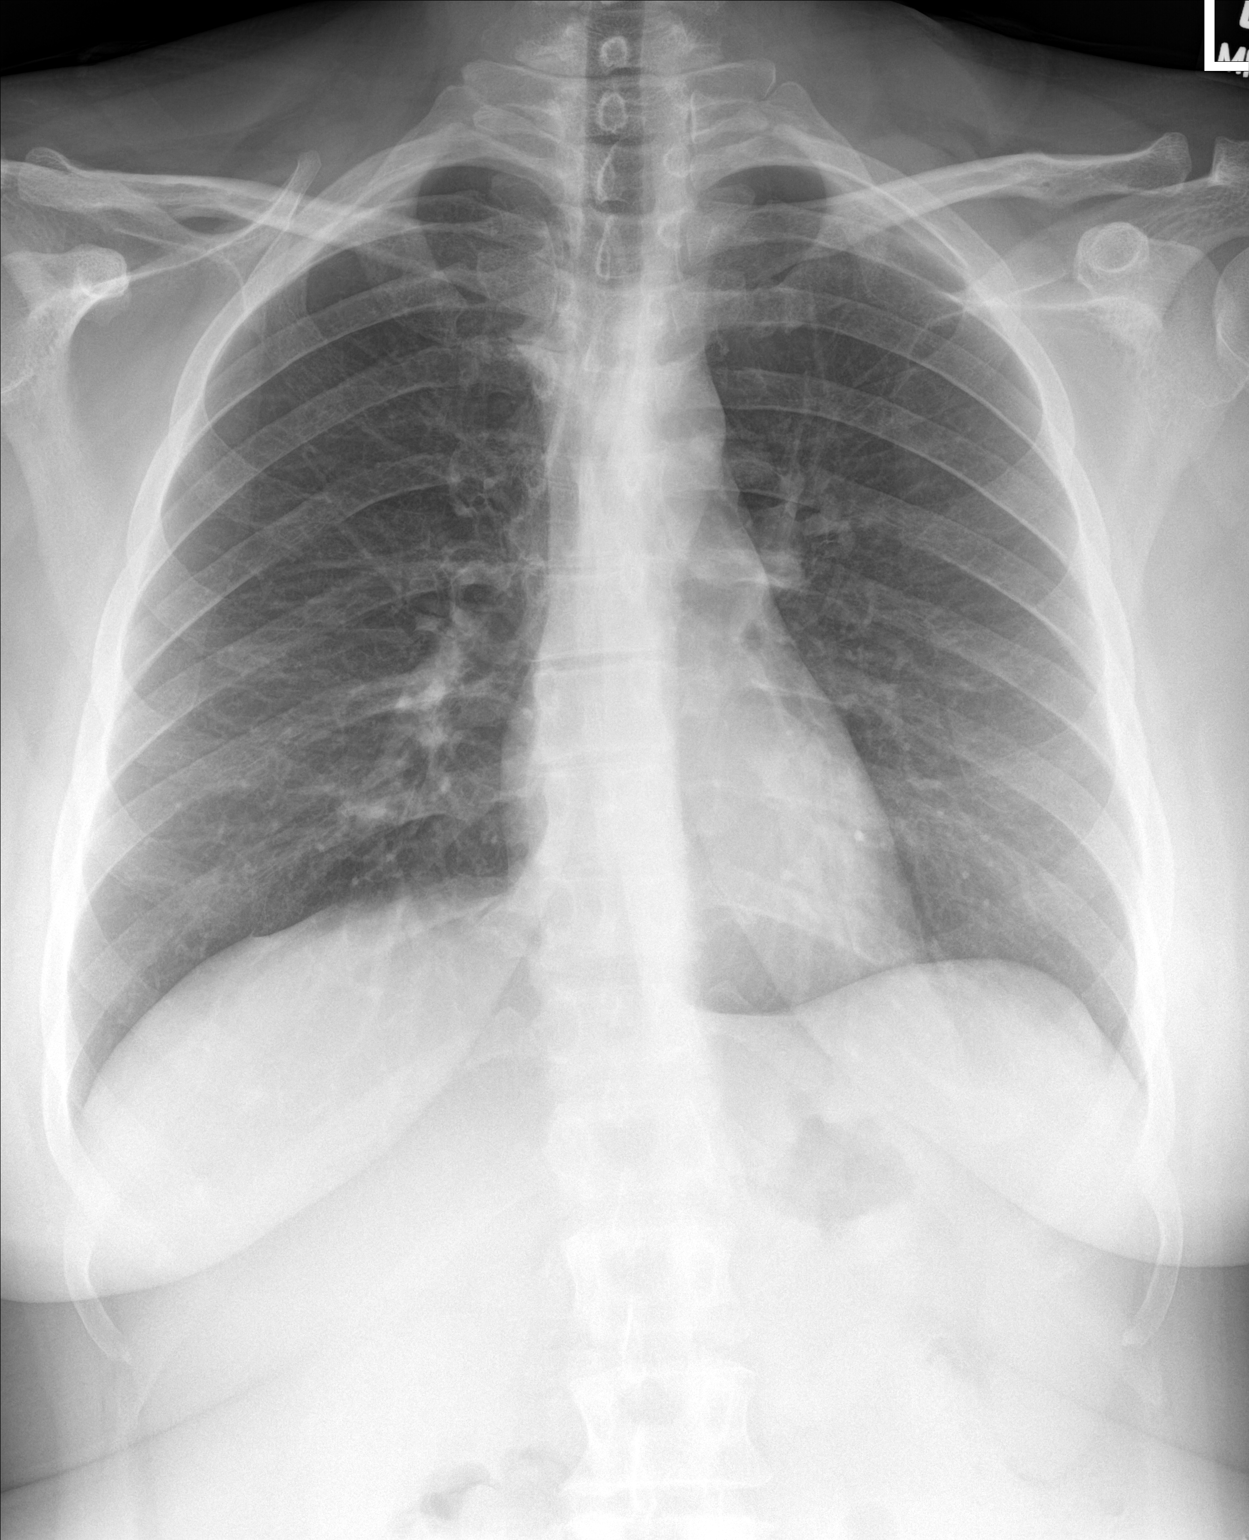

[chest lat]
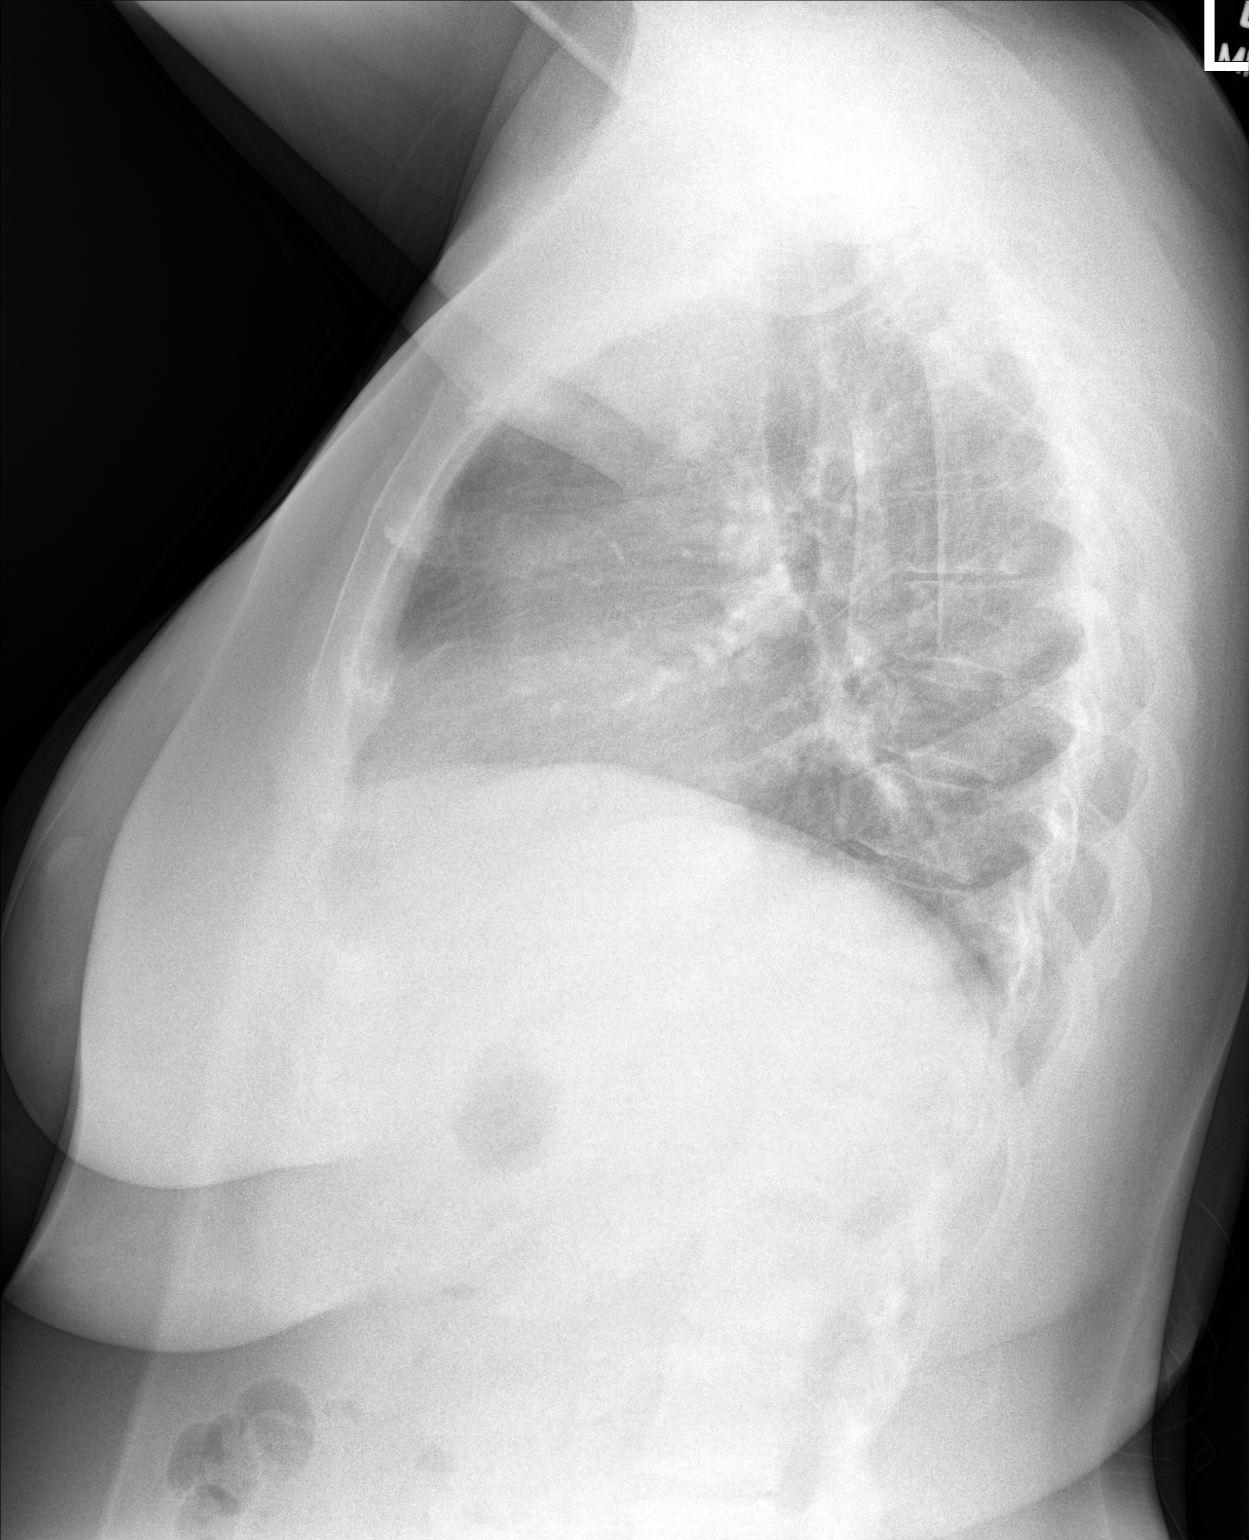

[2 of 2 positions shown; findings below may reference images not displayed]

FINDINGS: Lungs are clear. Heart size and pulmonary vascularity are normal. No
adenopathy. No bone lesions.
IMPRESSION: No abnormality noted.

## 2021-02-24 ENCOUNTER — Telehealth: Payer: Self-pay | Admitting: Family Medicine

## 2021-02-24 NOTE — Telephone Encounter (Signed)
Returned patients call - I did not call patient. Patient aware

## 2021-03-06 ENCOUNTER — Other Ambulatory Visit: Payer: Self-pay | Admitting: Internal Medicine

## 2021-03-16 ENCOUNTER — Ambulatory Visit: Payer: BC Managed Care – PPO | Admitting: Internal Medicine

## 2021-03-23 DIAGNOSIS — S335XXA Sprain of ligaments of lumbar spine, initial encounter: Secondary | ICD-10-CM | POA: Diagnosis not present

## 2021-03-23 DIAGNOSIS — S233XXA Sprain of ligaments of thoracic spine, initial encounter: Secondary | ICD-10-CM | POA: Diagnosis not present

## 2021-03-23 DIAGNOSIS — S134XXA Sprain of ligaments of cervical spine, initial encounter: Secondary | ICD-10-CM | POA: Diagnosis not present

## 2021-03-28 DIAGNOSIS — S335XXA Sprain of ligaments of lumbar spine, initial encounter: Secondary | ICD-10-CM | POA: Diagnosis not present

## 2021-03-28 DIAGNOSIS — S134XXA Sprain of ligaments of cervical spine, initial encounter: Secondary | ICD-10-CM | POA: Diagnosis not present

## 2021-03-28 DIAGNOSIS — S233XXA Sprain of ligaments of thoracic spine, initial encounter: Secondary | ICD-10-CM | POA: Diagnosis not present

## 2021-04-04 DIAGNOSIS — S233XXA Sprain of ligaments of thoracic spine, initial encounter: Secondary | ICD-10-CM | POA: Diagnosis not present

## 2021-04-04 DIAGNOSIS — S134XXA Sprain of ligaments of cervical spine, initial encounter: Secondary | ICD-10-CM | POA: Diagnosis not present

## 2021-04-04 DIAGNOSIS — S335XXA Sprain of ligaments of lumbar spine, initial encounter: Secondary | ICD-10-CM | POA: Diagnosis not present

## 2021-04-08 ENCOUNTER — Ambulatory Visit: Payer: BC Managed Care – PPO | Admitting: Internal Medicine

## 2021-04-08 ENCOUNTER — Encounter: Payer: Self-pay | Admitting: Family Medicine

## 2021-04-08 ENCOUNTER — Encounter: Payer: Self-pay | Admitting: Internal Medicine

## 2021-04-08 ENCOUNTER — Other Ambulatory Visit: Payer: Self-pay

## 2021-04-08 ENCOUNTER — Other Ambulatory Visit: Payer: Self-pay | Admitting: Family Medicine

## 2021-04-08 DIAGNOSIS — R058 Other specified cough: Secondary | ICD-10-CM | POA: Diagnosis not present

## 2021-04-08 DIAGNOSIS — E559 Vitamin D deficiency, unspecified: Secondary | ICD-10-CM

## 2021-04-08 DIAGNOSIS — R0609 Other forms of dyspnea: Secondary | ICD-10-CM

## 2021-04-08 MED ORDER — VITAMIN D3 1.25 MG (50000 UT) PO CAPS: 1.0000 | ORAL_CAPSULE | ORAL | 1 refills | Status: DC

## 2021-04-08 MED ORDER — ROSUVASTATIN CALCIUM 10 MG PO TABS: 10.0000 mg | ORAL_TABLET | Freq: Every day | ORAL | 1 refills | Status: DC

## 2021-04-08 MED ORDER — ALBUTEROL SULFATE HFA 108 (90 BASE) MCG/ACT IN AERS: 2.0000 | INHALATION_SPRAY | Freq: Four times a day (QID) | RESPIRATORY_TRACT | 2 refills | Status: DC | PRN

## 2021-04-08 NOTE — Patient Instructions (Addendum)
GERD (REFLUX)  is an extremely common cause of respiratory symptoms just like yours , many times with no obvious heartburn at all.    It can be treated with medication, but also with lifestyle changes including elevation of the head of your bed (ideally with 6 -8inch blocks under the headboard of your bed),  Smoking cessation, avoidance of late meals, excessive alcohol, and avoid fatty foods, chocolate, peppermint, colas, red wine, and acidic juices such as orange juice.  NO MINT OR MENTHOL PRODUCTS SO NO COUGH DROPS  USE SUGARLESS CANDY INSTEAD (Jolley ranchers or Stover's or Life Savers) or even ice chips will also do - the key is to swallow to prevent all throat clearing. NO OIL BASED VITAMINS - use powdered substitutes.  Avoid fish oil when coughing.    Please remember to go to the lab department   for your tests - we will call you with the results when they are available.  To get the most out of exercise, you need to be continuously aware that you are short of breath, but never out of breath, for at least 30 minutes daily. As you improve, it will actually be easier for you to do the same amount of exercise  in  30 minutes so always push to the level where you are short of breath.     If not satisfied you are making the progress you want after at least 6 weeks of the above routine I recommend CPST in [facility]

## 2021-04-08 NOTE — Assessment & Plan Note (Addendum)
Onset 2004 - DC ACEi   01/26/21 - Allergy screen 04/08/2021 >  Eos 0. /  IgE  Pending   Upper airway cough syndrome (previously labeled PNDS),  is so named because it's frequently impossible to sort out how much is  CR/sinusitis with freq throat clearing (which can be related to primary GERD)   vs  causing  secondary (" extra esophageal")  GERD from wide swings in gastric pressure that occur with throat clearing, often  promoting self use of mint and menthol lozenges that reduce the lower esophageal sphincter tone and exacerbate the problem further in a cyclical fashion.   These are the same pts (now being labeled as having "irritable larynx syndrome" by some cough centers) who not infrequently have a history of having failed to tolerate ace inhibitors,  dry powder inhalers or biphosphonates or report having atypical/extraesophageal reflux symptoms that don't respond to standard doses of PPI  and are easily confused as having aecopd or asthma flares by even experienced allergists/ pulmonologists (myself included).    rec  Continue singulair and prn zyrtec plus add gerd diet/ NO mints and f/u prn with allergy testing if indicated   Each maintenance medication was reviewed in detail including emphasizing most importantly the difference between maintenance and prns and under what circumstances the prns are to be triggered using an action plan format where appropriate.  Total time for H and P, chart review, counseling,  directly observing portions of ambulatory 02 saturation study/ and generating customized AVS unique to this summary final office visit / same day charting  > 30 min

## 2021-04-08 NOTE — Progress Notes (Signed)
Kelli May, female    DOB: 01-11-1976,   MRN: [id number]   Brief patient profile:  30  yowf quit smoking 2001 with h/o seasonal rhinitis/urge to clear throat onset 2004 referred to pulmonary clinic in [place]  01/26/2021 by Hendricks Limes  for ? Post covid syndrome from "bilateral pneumonia" Dec 2020  with clear cxr 08/15/19 but persitent dry cough (not seasonal) and sob at rest and with heavy exertion (but not in between)   Baseline able jog 5 k  in 44 min  @ 218 last  did this in fall of 2020  @ 13 min mile avg on montelukast and zyrtec seemed to help  damper the seasonal symptoms but not helping her at time of initial ov    History of Present Illness  01/26/2021  Pulmonary/ 1st office eval/ Jamirah Zelaya / [place] [facility] on ACEi / s/p Dec 2021 covid on lisinopril 2016  Chief Complaint  Patient presents with   Consult    Covid + 01/2018 had bilateral pneumonia. SOB since then when walking and talking.   Dyspnea: 15 min power walk then had to quit when lost breath, worse with talking either during ex or at rest  but not at all proportionate to aerobic activity  Cough: no / just throat clearing  Sleep: no resp problem  SABA use: not using  Rec Stop lisinopril and start losartan 50 mg one daily. To get the most out of exercise, you need to be continuously aware that you are short of breath, but never out of breath, for at least 30 minutes daily Make sure you check your oxygen saturations at highest level of activity  Please schedule a follow up office visit in 6 weeks, call sooner if needed    04/08/2021  f/u ov/[place] office/[facility] Tozer re: doe/cough maint off acei/ on singulair   Chief Complaint  Patient presents with   Follow-up    SOB is about the same. Feeling more fatigue and dizziness than usual.    Dyspnea: steps now are a problem Cough: better off acei  Sleeping: ro resp cc  SABA use: none  02: none      No obvious day to day or daytime variability or assoc excess/  purulent sputum or mucus plugs or hemoptysis or cp or chest tightness, subjective wheeze or overt sinus or hb symptoms.   Sleeping  without nocturnal  or early am exacerbation  of respiratory  c/o's or need for noct saba. Also denies any obvious fluctuation of symptoms with weather or environmental changes or other aggravating or alleviating factors except as outlined above   No unusual exposure hx or h/o childhood pna/ asthma or knowledge of premature birth.  Current Allergies, Complete Past Medical History, Past Surgical History, Family History, and Social History were reviewed in [employer] record.  ROS  The following are not active complaints unless bolded Hoarseness, sore throat, dysphagia, dental problems, itching, sneezing,  nasal congestion or discharge of excess mucus or purulent secretions, ear ache,   fever, chills, sweats, unintended wt loss or wt gain, classically pleuritic or exertional cp,  orthopnea pnd or arm/hand swelling  or leg swelling, presyncope, palpitations, abdominal pain, anorexia, nausea, vomiting, diarrhea  or change in bowel habits or change in bladder habits, change in stools or change in urine, dysuria, hematuria,  rash, arthralgias, visual complaints, headache, numbness, weakness or ataxia or problems with walking or coordination,  change in mood or  memory.  Current Meds  Medication Sig   albuterol (VENTOLIN HFA) 108 (90 Base) MCG/ACT inhaler Inhale 2 puffs into the lungs every 6 (six) hours as needed.   aspirin EC 81 MG tablet Take 81 mg by mouth daily. Swallow whole.   butalbital-acetaminophen-caffeine (FIORICET) 50-325-40 MG tablet Take 1-2 tablets by mouth 2 (two) times daily as needed for headache or migraine.   cetirizine (ZYRTEC) 10 MG tablet Take 1 tablet (10 mg total) by mouth daily.   Cholecalciferol (VITAMIN D3) 1.25 MG (50000 UT) CAPS Take 1 tablet by mouth 2 (two) times a week.   Clobetasol Propionate 0.05 % shampoo APPLY  THIN FILM TOPICALLY TO DRY SCALP ONCE DAILY- LEAVE ON 15 MINUTES BEFORE LATHERING AND RINSING   cyclobenzaprine (FLEXERIL) 5 MG tablet Take 1 tablet (5 mg total) by mouth 3 (three) times daily as needed for muscle spasms.   losartan (COZAAR) 50 MG tablet TAKE 1 TABLET BY MOUTH EVERY DAY   montelukast (SINGULAIR) 10 MG tablet TAKE 1 TABLET BY MOUTH EVERYDAY AT BEDTIME   promethazine (PHENERGAN) 25 MG tablet TAKE 1 TABLET (25 MG TOTAL) BY MOUTH EVERY 6 (SIX) HOURS AS NEEDED FOR NAUSEA OR VOMITING.   rosuvastatin (CRESTOR) 10 MG tablet Take 10 mg by mouth daily.   Current Facility-Administered Medications for the 04/08/21 encounter (Office Visit) with Tanda Rockers, MD  Medication   losartan (COZAAR) tablet 50 mg                   Past Medical History:  Diagnosis Date   Allergy    Arthritis    Hypertension    Vitamin D deficiency        Objective:    Wt Readings from Last 3 Encounters:  04/08/21 237 lb (107.5 kg)  01/26/21 235 lb 0.6 oz (106.6 kg)  11/18/20 247 lb 6.4 oz (112.2 kg)      Vital signs reviewed  04/08/2021  - Note at rest 02 sats  100% on RA   General appearance:    amb pleasant mod obese wf nad/ occ throat clearing    HEENT : pt wearing mask not removed for exam due to covid -19 concerns.    NECK :  without JVD/Nodes/TM/ nl carotid upstrokes bilaterally   LUNGS: no acc muscle use,  Nl contour chest which is clear to A and P bilaterally with occ cough early on insp maneuvers   CV:  RRR  no s3 or murmur or increase in P2, and no edema   ABD:  soft and nontender with nl inspiratory excursion in the supine position. No bruits or organomegaly appreciated, bowel sounds nl  MS:  Nl gait/ ext warm without deformities, calf tenderness, cyanosis or clubbing No obvious joint restrictions   SKIN: warm and dry without lesions    NEURO:  alert, approp, nl sensorium with  no motor or cerebellar deficits apparent.         Assessment

## 2021-04-08 NOTE — Assessment & Plan Note (Signed)
Onset Dec 2021 with covid but nl cxr 08/15/19 and "feels the same since"  -  01/26/2021   Walked on RA   x  3  lap(s) =  approx 450  ft  @ fast pace, stopped due to end of study c/o sob all 3 with lowest 02 sats 96%   -  01/26/2021 try off ACEi >iimproved 04/08/2021  - 04/08/2021   Walked on RA  x  3  lap(s) =  approx 450  ft  @ very rapid pace, stopped due to end of studying with mild sob last lap with lowest 02 sats 97%   No evidence of a pulmonary problem at this point  - most likely this is all conditioning related with next step(s)  1) sub max ex > 30 min at least 3 x weekly  2) f/u p > 6 weeks with cpst in [place] prn

## 2021-04-11 DIAGNOSIS — S134XXA Sprain of ligaments of cervical spine, initial encounter: Secondary | ICD-10-CM | POA: Diagnosis not present

## 2021-04-11 DIAGNOSIS — S335XXA Sprain of ligaments of lumbar spine, initial encounter: Secondary | ICD-10-CM | POA: Diagnosis not present

## 2021-04-11 DIAGNOSIS — S233XXA Sprain of ligaments of thoracic spine, initial encounter: Secondary | ICD-10-CM | POA: Diagnosis not present

## 2021-04-14 LAB — CBC WITH DIFFERENTIAL/PLATELET
Basophils Absolute: 0.1 10*3/uL (ref 0.0–0.2)
Basos: 1 %
EOS (ABSOLUTE): 0.3 10*3/uL (ref 0.0–0.4)
Eos: 3 %
Hematocrit: 40.5 % (ref 34.0–46.6)
Hemoglobin: 13.5 g/dL (ref 11.1–15.9)
Immature Grans (Abs): 0 10*3/uL (ref 0.0–0.1)
Immature Granulocytes: 0 %
Lymphocytes Absolute: 1.9 10*3/uL (ref 0.7–3.1)
Lymphs: 25 %
MCH: 28.8 pg (ref 26.6–33.0)
MCHC: 33.3 g/dL (ref 31.5–35.7)
MCV: 86 fL (ref 79–97)
Monocytes Absolute: 0.6 10*3/uL (ref 0.1–0.9)
Monocytes: 8 %
Neutrophils Absolute: 4.8 10*3/uL (ref 1.4–7.0)
Neutrophils: 63 %
Platelets: 232 10*3/uL (ref 150–450)
RBC: 4.69 x10E6/uL (ref 3.77–5.28)
RDW: 12.4 % (ref 11.7–15.4)
WBC: 7.7 10*3/uL (ref 3.4–10.8)

## 2021-04-14 LAB — IGE: IgE (Immunoglobulin E), Serum: 15 IU/mL (ref 6–495)

## 2021-04-18 DIAGNOSIS — S134XXA Sprain of ligaments of cervical spine, initial encounter: Secondary | ICD-10-CM | POA: Diagnosis not present

## 2021-04-18 DIAGNOSIS — S335XXA Sprain of ligaments of lumbar spine, initial encounter: Secondary | ICD-10-CM | POA: Diagnosis not present

## 2021-04-18 DIAGNOSIS — S233XXA Sprain of ligaments of thoracic spine, initial encounter: Secondary | ICD-10-CM | POA: Diagnosis not present

## 2021-04-25 DIAGNOSIS — S233XXA Sprain of ligaments of thoracic spine, initial encounter: Secondary | ICD-10-CM | POA: Diagnosis not present

## 2021-04-25 DIAGNOSIS — S335XXA Sprain of ligaments of lumbar spine, initial encounter: Secondary | ICD-10-CM | POA: Diagnosis not present

## 2021-04-25 DIAGNOSIS — S134XXA Sprain of ligaments of cervical spine, initial encounter: Secondary | ICD-10-CM | POA: Diagnosis not present

## 2021-05-09 DIAGNOSIS — S233XXA Sprain of ligaments of thoracic spine, initial encounter: Secondary | ICD-10-CM | POA: Diagnosis not present

## 2021-05-09 DIAGNOSIS — S134XXA Sprain of ligaments of cervical spine, initial encounter: Secondary | ICD-10-CM | POA: Diagnosis not present

## 2021-05-09 DIAGNOSIS — S335XXA Sprain of ligaments of lumbar spine, initial encounter: Secondary | ICD-10-CM | POA: Diagnosis not present

## 2021-05-10 ENCOUNTER — Encounter: Payer: Self-pay | Admitting: Family Medicine

## 2021-05-10 ENCOUNTER — Ambulatory Visit: Payer: BC Managed Care – PPO | Admitting: Family Medicine

## 2021-05-10 VITALS — BP 122/79 | HR 63 | Temp 97.9°F | Ht 67.0 in | Wt 238.6 lb

## 2021-05-10 DIAGNOSIS — I10 Essential (primary) hypertension: Secondary | ICD-10-CM | POA: Diagnosis not present

## 2021-05-10 DIAGNOSIS — J0140 Acute pansinusitis, unspecified: Secondary | ICD-10-CM | POA: Diagnosis not present

## 2021-05-10 DIAGNOSIS — R519 Headache, unspecified: Secondary | ICD-10-CM

## 2021-05-10 DIAGNOSIS — Z86010 Personal history of colonic polyps: Secondary | ICD-10-CM

## 2021-05-10 MED ORDER — LOSARTAN POTASSIUM 50 MG PO TABS: 50.0000 mg | ORAL_TABLET | Freq: Every day | ORAL | 1 refills | Status: DC

## 2021-05-10 MED ORDER — AZITHROMYCIN 250 MG PO TABS: ORAL_TABLET | ORAL | 0 refills | Status: DC

## 2021-05-10 MED ORDER — BUTALBITAL-APAP-CAFFEINE 50-325-40 MG PO TABS: 1.0000 | ORAL_TABLET | Freq: Two times a day (BID) | ORAL | 5 refills | Status: DC | PRN

## 2021-05-10 NOTE — Progress Notes (Signed)
? ?Assessment & Plan:  ?1. Acute non-recurrent pansinusitis ?- azithromycin (ZITHROMAX Z-PAK) 250 MG tablet; Take 2 tablets (500 mg) PO today, then 1 tablet (250 mg) PO daily x4 days.  Dispense: 6 tablet; Refill: 0 ? ?2. Frequent headaches ?Plan to treat sinus infection first.  If headaches do not resolve, we will change her blood pressure medication. ?- butalbital-acetaminophen-caffeine (FIORICET) 50-325-40 MG tablet; Take 1-2 tablets by mouth 2 (two) times daily as needed for headache or migraine.  Dispense: 14 tablet; Refill: 5 ? ?3. Essential hypertension ?Well controlled on current regimen.  ?- losartan (COZAAR) 50 MG tablet; Take 1 tablet (50 mg total) by mouth daily.  Dispense: 90 tablet; Refill: 1 ? ?4. Personal history of colonic polyps ?- Ambulatory referral to Gastroenterology ? ? ?Follow up plan: Return Return if headaches do not resolve with treatment of sinus infection. Otherwise, for CPE. ? ?Deliah Boston, MSN, APRN, FNP-C ?[facility] ? ?Subjective:  ? ?Patient ID: Kelli May, female    DOB: 01/18/1976, 46 y.o.   MRN: [id number] ? ?HPI: ?Kelli May is a 46 y.o. female presenting on 05/10/2021 for Migraine (Daily x 1 month ) ? ?Migraines: Patient has a history of migraines for which she uses Fioricet.  Prior to the past month she only needed this once monthly.  She takes magnesium, co-Q10, and vitamin B2 for preventive therapy.  She is currently having daily headaches.  She states she wakes up with the headache and it intensifies every other day.  This started approximately 4 weeks ago when she came down with a sinus infection and also her pulmonologist changed her lisinopril to losartan.  She is therefore not sure which is causing the daily headaches, since she is still dealing with her sinus issues.  She takes Zyrtec and Singulair daily. ? ? ?ROS: Negative unless specifically indicated above in HPI.  ? ?Relevant past medical history reviewed and updated  as indicated.  ? ?Allergies and medications reviewed and updated. ? ? ?Current Outpatient Medications:  ?  albuterol (VENTOLIN HFA) 108 (90 Base) MCG/ACT inhaler, Inhale 2 puffs into the lungs every 6 (six) hours as needed for wheezing or shortness of breath., Disp: 18 g, Rfl: 2 ?  aspirin EC 81 MG tablet, Take 81 mg by mouth daily. Swallow whole., Disp: , Rfl:  ?  butalbital-acetaminophen-caffeine (FIORICET) 50-325-40 MG tablet, Take 1-2 tablets by mouth 2 (two) times daily as needed for headache or migraine., Disp: 14 tablet, Rfl: 5 ?  cetirizine (ZYRTEC) 10 MG tablet, Take 1 tablet (10 mg total) by mouth daily., Disp: 90 tablet, Rfl: 1 ?  Cholecalciferol (VITAMIN D3) 1.25 MG (50000 UT) CAPS, Take 1 tablet by mouth 2 (two) times a week., Disp: 24 capsule, Rfl: 1 ?  Clobetasol Propionate 0.05 % shampoo, APPLY THIN FILM TOPICALLY TO DRY SCALP ONCE DAILY- LEAVE ON 15 MINUTES BEFORE LATHERING AND RINSING, Disp: 118 mL, Rfl: 2 ?  cyclobenzaprine (FLEXERIL) 5 MG tablet, Take 1 tablet (5 mg total) by mouth 3 (three) times daily as needed for muscle spasms., Disp: 30 tablet, Rfl: 5 ?  losartan (COZAAR) 50 MG tablet, TAKE 1 TABLET BY MOUTH EVERY DAY, Disp: 30 tablet, Rfl: 0 ?  montelukast (SINGULAIR) 10 MG tablet, TAKE 1 TABLET BY MOUTH EVERYDAY AT BEDTIME, Disp: 90 tablet, Rfl: 3 ?  promethazine (PHENERGAN) 25 MG tablet, TAKE 1 TABLET (25 MG TOTAL) BY MOUTH EVERY 6 (SIX) HOURS AS NEEDED FOR NAUSEA OR VOMITING., Disp: 30 tablet, Rfl: 2 ?  rosuvastatin (CRESTOR) 10 MG tablet, Take 1 tablet (10 mg total) by mouth daily., Disp: 90 tablet, Rfl: 1 ? ?Current Facility-Administered Medications:  ?  losartan (COZAAR) tablet 50 mg, 50 mg, Oral, Once, Nyoka Cowden, MD ? ?Allergies  ?Allergen Reactions  ? Penicillins Hives  ? ? ?Objective:  ? ?BP 122/79   Pulse 63   Temp 97.9 ?F (36.6 ?C) (Temporal)   Ht 5\' 7"  (1.702 m)   Wt 238 lb 9.6 oz (108.2 kg)   SpO2 97%   BMI 37.37 kg/m?   ? ?Physical Exam ?Vitals reviewed.   ?Constitutional:   ?   General: She is not in acute distress. ?   Appearance: Normal appearance. She is not ill-appearing, toxic-appearing or diaphoretic.  ?HENT:  ?   Head: Normocephalic and atraumatic.  ?   Right Ear: Tympanic membrane, ear canal and external ear normal. There is no impacted cerumen.  ?   Left Ear: Tympanic membrane, ear canal and external ear normal. There is no impacted cerumen.  ?   Nose: No congestion or rhinorrhea.  ?   Right Sinus: Maxillary sinus tenderness and frontal sinus tenderness present.  ?   Left Sinus: Maxillary sinus tenderness and frontal sinus tenderness present.  ?   Mouth/Throat:  ?   Mouth: Mucous membranes are moist.  ?   Pharynx: Oropharynx is clear. No oropharyngeal exudate or posterior oropharyngeal erythema.  ?Eyes:  ?   General: No scleral icterus.    ?   Right eye: No discharge.     ?   Left eye: No discharge.  ?   Conjunctiva/sclera: Conjunctivae normal.  ?Cardiovascular:  ?   Rate and Rhythm: Normal rate and regular rhythm.  ?   Heart sounds: Normal heart sounds. No murmur heard. ?  No friction rub. No gallop.  ?Pulmonary:  ?   Effort: Pulmonary effort is normal. No respiratory distress.  ?   Breath sounds: Normal breath sounds. No stridor. No wheezing, rhonchi or rales.  ?Musculoskeletal:     ?   General: Normal range of motion.  ?   Cervical back: Normal range of motion.  ?Lymphadenopathy:  ?   Cervical: No cervical adenopathy.  ?Skin: ?   General: Skin is warm and dry.  ?   Capillary Refill: Capillary refill takes less than 2 seconds.  ?Neurological:  ?   General: No focal deficit present.  ?   Mental Status: She is alert and oriented to person, place, and time. Mental status is at baseline.  ?Psychiatric:     ?   Mood and Affect: Mood normal.     ?   Behavior: Behavior normal.     ?   Thought Content: Thought content normal.     ?   Judgment: Judgment normal.  ? ? ? ? ? ? ?

## 2021-05-16 ENCOUNTER — Encounter: Payer: Self-pay | Admitting: Family Medicine

## 2021-05-19 ENCOUNTER — Encounter: Payer: Self-pay | Admitting: Internal Medicine

## 2021-05-25 DIAGNOSIS — S233XXA Sprain of ligaments of thoracic spine, initial encounter: Secondary | ICD-10-CM | POA: Diagnosis not present

## 2021-05-25 DIAGNOSIS — S134XXA Sprain of ligaments of cervical spine, initial encounter: Secondary | ICD-10-CM | POA: Diagnosis not present

## 2021-05-25 DIAGNOSIS — S335XXA Sprain of ligaments of lumbar spine, initial encounter: Secondary | ICD-10-CM | POA: Diagnosis not present

## 2021-06-06 DIAGNOSIS — S233XXA Sprain of ligaments of thoracic spine, initial encounter: Secondary | ICD-10-CM | POA: Diagnosis not present

## 2021-06-06 DIAGNOSIS — S134XXA Sprain of ligaments of cervical spine, initial encounter: Secondary | ICD-10-CM | POA: Diagnosis not present

## 2021-06-06 DIAGNOSIS — S335XXA Sprain of ligaments of lumbar spine, initial encounter: Secondary | ICD-10-CM | POA: Diagnosis not present

## 2021-06-13 DIAGNOSIS — S233XXA Sprain of ligaments of thoracic spine, initial encounter: Secondary | ICD-10-CM | POA: Diagnosis not present

## 2021-06-13 DIAGNOSIS — S335XXA Sprain of ligaments of lumbar spine, initial encounter: Secondary | ICD-10-CM | POA: Diagnosis not present

## 2021-06-13 DIAGNOSIS — S134XXA Sprain of ligaments of cervical spine, initial encounter: Secondary | ICD-10-CM | POA: Diagnosis not present

## 2021-06-14 ENCOUNTER — Encounter: Payer: Self-pay | Admitting: Family Medicine

## 2021-06-15 MED ORDER — AMLODIPINE BESYLATE 5 MG PO TABS: 5.0000 mg | ORAL_TABLET | Freq: Every day | ORAL | 2 refills | Status: DC

## 2021-06-20 DIAGNOSIS — S335XXA Sprain of ligaments of lumbar spine, initial encounter: Secondary | ICD-10-CM | POA: Diagnosis not present

## 2021-06-20 DIAGNOSIS — S233XXA Sprain of ligaments of thoracic spine, initial encounter: Secondary | ICD-10-CM | POA: Diagnosis not present

## 2021-06-20 DIAGNOSIS — S134XXA Sprain of ligaments of cervical spine, initial encounter: Secondary | ICD-10-CM | POA: Diagnosis not present

## 2021-06-27 DIAGNOSIS — S335XXA Sprain of ligaments of lumbar spine, initial encounter: Secondary | ICD-10-CM | POA: Diagnosis not present

## 2021-06-27 DIAGNOSIS — S134XXA Sprain of ligaments of cervical spine, initial encounter: Secondary | ICD-10-CM | POA: Diagnosis not present

## 2021-06-27 DIAGNOSIS — S233XXA Sprain of ligaments of thoracic spine, initial encounter: Secondary | ICD-10-CM | POA: Diagnosis not present

## 2021-07-13 DIAGNOSIS — S233XXA Sprain of ligaments of thoracic spine, initial encounter: Secondary | ICD-10-CM | POA: Diagnosis not present

## 2021-07-13 DIAGNOSIS — S134XXA Sprain of ligaments of cervical spine, initial encounter: Secondary | ICD-10-CM | POA: Diagnosis not present

## 2021-07-13 DIAGNOSIS — S335XXA Sprain of ligaments of lumbar spine, initial encounter: Secondary | ICD-10-CM | POA: Diagnosis not present

## 2021-07-18 DIAGNOSIS — S335XXA Sprain of ligaments of lumbar spine, initial encounter: Secondary | ICD-10-CM | POA: Diagnosis not present

## 2021-07-18 DIAGNOSIS — S134XXA Sprain of ligaments of cervical spine, initial encounter: Secondary | ICD-10-CM | POA: Diagnosis not present

## 2021-07-18 DIAGNOSIS — S233XXA Sprain of ligaments of thoracic spine, initial encounter: Secondary | ICD-10-CM | POA: Diagnosis not present

## 2021-07-19 ENCOUNTER — Encounter: Payer: Self-pay | Admitting: *Deleted

## 2021-07-21 ENCOUNTER — Ambulatory Visit: Payer: BC Managed Care – PPO

## 2021-07-25 DIAGNOSIS — S134XXA Sprain of ligaments of cervical spine, initial encounter: Secondary | ICD-10-CM | POA: Diagnosis not present

## 2021-07-25 DIAGNOSIS — S233XXA Sprain of ligaments of thoracic spine, initial encounter: Secondary | ICD-10-CM | POA: Diagnosis not present

## 2021-07-25 DIAGNOSIS — S335XXA Sprain of ligaments of lumbar spine, initial encounter: Secondary | ICD-10-CM | POA: Diagnosis not present

## 2021-07-28 ENCOUNTER — Other Ambulatory Visit: Payer: Self-pay | Admitting: Family Medicine

## 2021-07-28 DIAGNOSIS — E559 Vitamin D deficiency, unspecified: Secondary | ICD-10-CM

## 2021-07-29 NOTE — Telephone Encounter (Signed)
Last OV 05/10/21. Last RF 04/11/21 #24 1rf. Next OV no upcoming appt scheduled. Vit d level 4.1  1 08/15/19

## 2021-08-01 ENCOUNTER — Other Ambulatory Visit: Payer: Self-pay | Admitting: Family Medicine

## 2021-08-01 DIAGNOSIS — S335XXA Sprain of ligaments of lumbar spine, initial encounter: Secondary | ICD-10-CM | POA: Diagnosis not present

## 2021-08-01 DIAGNOSIS — L409 Psoriasis, unspecified: Secondary | ICD-10-CM

## 2021-08-01 DIAGNOSIS — S134XXA Sprain of ligaments of cervical spine, initial encounter: Secondary | ICD-10-CM | POA: Diagnosis not present

## 2021-08-01 DIAGNOSIS — S233XXA Sprain of ligaments of thoracic spine, initial encounter: Secondary | ICD-10-CM | POA: Diagnosis not present

## 2021-08-02 ENCOUNTER — Other Ambulatory Visit: Payer: Self-pay | Admitting: Family Medicine

## 2021-08-02 DIAGNOSIS — L409 Psoriasis, unspecified: Secondary | ICD-10-CM

## 2021-08-02 NOTE — Telephone Encounter (Signed)
Pharmacy comment: Alternative Requested: PT IS REQUESTING SOLUTION BECAUSE INSURANCE WILL ONLY PAY FOR SOLUTION ALT REQUESTED.

## 2021-08-03 NOTE — Telephone Encounter (Signed)
Pharmacy comment: Alternative Requested:COST OVER $200 ON A DISCOUNT CARD; DRUG NOT COVERED BY INSURANCE; PLEASE CONTACT INSURANCE OR CONSIDER ALTERNATIVE.   Note from 08/02/21 Pharmacy comment: Alternative Requested: PT IS REQUESTING SOLUTION BECAUSE INSURANCE WILL ONLY PAY FOR SOLUTION ALT REQUESTED.

## 2021-08-03 NOTE — Telephone Encounter (Signed)
Order changed.

## 2021-08-10 DIAGNOSIS — S335XXA Sprain of ligaments of lumbar spine, initial encounter: Secondary | ICD-10-CM | POA: Diagnosis not present

## 2021-08-10 DIAGNOSIS — S134XXA Sprain of ligaments of cervical spine, initial encounter: Secondary | ICD-10-CM | POA: Diagnosis not present

## 2021-08-10 DIAGNOSIS — S233XXA Sprain of ligaments of thoracic spine, initial encounter: Secondary | ICD-10-CM | POA: Diagnosis not present

## 2021-08-15 DIAGNOSIS — S335XXA Sprain of ligaments of lumbar spine, initial encounter: Secondary | ICD-10-CM | POA: Diagnosis not present

## 2021-08-15 DIAGNOSIS — S233XXA Sprain of ligaments of thoracic spine, initial encounter: Secondary | ICD-10-CM | POA: Diagnosis not present

## 2021-08-15 DIAGNOSIS — S134XXA Sprain of ligaments of cervical spine, initial encounter: Secondary | ICD-10-CM | POA: Diagnosis not present

## 2021-08-22 DIAGNOSIS — S134XXA Sprain of ligaments of cervical spine, initial encounter: Secondary | ICD-10-CM | POA: Diagnosis not present

## 2021-08-22 DIAGNOSIS — S233XXA Sprain of ligaments of thoracic spine, initial encounter: Secondary | ICD-10-CM | POA: Diagnosis not present

## 2021-08-22 DIAGNOSIS — S335XXA Sprain of ligaments of lumbar spine, initial encounter: Secondary | ICD-10-CM | POA: Diagnosis not present

## 2021-08-29 DIAGNOSIS — S233XXA Sprain of ligaments of thoracic spine, initial encounter: Secondary | ICD-10-CM | POA: Diagnosis not present

## 2021-08-29 DIAGNOSIS — S335XXA Sprain of ligaments of lumbar spine, initial encounter: Secondary | ICD-10-CM | POA: Diagnosis not present

## 2021-08-29 DIAGNOSIS — S134XXA Sprain of ligaments of cervical spine, initial encounter: Secondary | ICD-10-CM | POA: Diagnosis not present

## 2021-09-05 DIAGNOSIS — S233XXA Sprain of ligaments of thoracic spine, initial encounter: Secondary | ICD-10-CM | POA: Diagnosis not present

## 2021-09-05 DIAGNOSIS — S335XXA Sprain of ligaments of lumbar spine, initial encounter: Secondary | ICD-10-CM | POA: Diagnosis not present

## 2021-09-05 DIAGNOSIS — S134XXA Sprain of ligaments of cervical spine, initial encounter: Secondary | ICD-10-CM | POA: Diagnosis not present

## 2021-09-14 ENCOUNTER — Other Ambulatory Visit: Payer: Self-pay | Admitting: Family Medicine

## 2021-09-19 DIAGNOSIS — S134XXA Sprain of ligaments of cervical spine, initial encounter: Secondary | ICD-10-CM | POA: Diagnosis not present

## 2021-09-19 DIAGNOSIS — S335XXA Sprain of ligaments of lumbar spine, initial encounter: Secondary | ICD-10-CM | POA: Diagnosis not present

## 2021-09-19 DIAGNOSIS — S233XXA Sprain of ligaments of thoracic spine, initial encounter: Secondary | ICD-10-CM | POA: Diagnosis not present

## 2021-09-23 ENCOUNTER — Encounter: Payer: Self-pay | Admitting: Family Medicine

## 2021-09-23 ENCOUNTER — Ambulatory Visit (INDEPENDENT_AMBULATORY_CARE_PROVIDER_SITE_OTHER): Payer: BC Managed Care – PPO | Admitting: Family Medicine

## 2021-09-23 VITALS — BP 130/88 | HR 70 | Temp 97.8°F | Ht 67.0 in | Wt 245.0 lb

## 2021-09-23 DIAGNOSIS — Z0001 Encounter for general adult medical examination with abnormal findings: Secondary | ICD-10-CM | POA: Diagnosis not present

## 2021-09-23 DIAGNOSIS — Z0000 Encounter for general adult medical examination without abnormal findings: Secondary | ICD-10-CM | POA: Diagnosis not present

## 2021-09-23 DIAGNOSIS — E559 Vitamin D deficiency, unspecified: Secondary | ICD-10-CM

## 2021-09-23 DIAGNOSIS — E785 Hyperlipidemia, unspecified: Secondary | ICD-10-CM | POA: Diagnosis not present

## 2021-09-23 DIAGNOSIS — E669 Obesity, unspecified: Secondary | ICD-10-CM

## 2021-09-23 DIAGNOSIS — I10 Essential (primary) hypertension: Secondary | ICD-10-CM | POA: Diagnosis not present

## 2021-09-23 DIAGNOSIS — R519 Headache, unspecified: Secondary | ICD-10-CM

## 2021-09-23 DIAGNOSIS — G43019 Migraine without aura, intractable, without status migrainosus: Secondary | ICD-10-CM | POA: Diagnosis not present

## 2021-09-23 MED ORDER — CYCLOBENZAPRINE HCL 5 MG PO TABS
5.0000 mg | ORAL_TABLET | Freq: Three times a day (TID) | ORAL | 5 refills | Status: DC | PRN
Start: 2021-09-23 — End: 2022-01-27

## 2021-09-23 MED ORDER — ROSUVASTATIN CALCIUM 10 MG PO TABS: 10.0000 mg | ORAL_TABLET | Freq: Every day | ORAL | 3 refills | Status: DC

## 2021-09-23 MED ORDER — BUTALBITAL-APAP-CAFFEINE 50-325-40 MG PO TABS: 1.0000 | ORAL_TABLET | Freq: Two times a day (BID) | ORAL | 5 refills | Status: DC | PRN

## 2021-09-23 MED ORDER — TOPIRAMATE 25 MG PO TABS: 25.0000 mg | ORAL_TABLET | Freq: Two times a day (BID) | ORAL | 2 refills | Status: DC

## 2021-09-23 MED ORDER — AMLODIPINE BESYLATE 5 MG PO TABS
5.0000 mg | ORAL_TABLET | Freq: Every day | ORAL | 3 refills | Status: DC
Start: 2021-09-23 — End: 2023-05-14

## 2021-09-23 MED ORDER — RIZATRIPTAN BENZOATE 5 MG PO TABS: 5.0000 mg | ORAL_TABLET | ORAL | 0 refills | Status: DC | PRN

## 2021-09-23 NOTE — Progress Notes (Signed)
Assessment & Plan:  Well adult exam Discussed health benefits of physical activity, and encouraged her to engage in regular exercise appropriate for her age and condition. Preventive health education provided. Declined COVID vaccine. Patient to schedule her mammogram on the bus that comes to our office.  Immunization History  Administered Date(s) Administered  . Influenza,Quad,Nasal, Live 11/13/2020  . Tdap 06/03/2014   Health Maintenance  Topic Date Due  . MAMMOGRAM  06/18/2020  . COVID-19 Vaccine (1) 10/09/2021 (Originally 02/06/1976)  . INFLUENZA VACCINE  05/14/2022 (Originally 09/13/2021)  . TETANUS/TDAP  06/02/2024  . Hepatitis C Screening  Completed  . HIV Screening  Completed  . HPV VACCINES  Aged Out    ***  Follow-up: No follow-ups on file.   Deliah Boston, MSN, APRN, FNP-C [facility]  Subjective:  Patient ID: Kelli May, female    DOB: February 01, 1976  Age: 46 y.o. MRN: [id number]  Patient Care Team: Gwenlyn Fudge, FNP as PCP - General (Family Medicine)   CC:  Chief Complaint  Patient presents with  . Annual Exam  . Migraine    Ongoing and have not gotten any better     HPI Bethaney Oshana is a 46 y.o. female who presents today for a complete physical exam. She reports consuming a general diet. The patient does not participate in regular exercise at present. She generally feels fairly well. She reports sleeping well. She does have additional problems to discuss today.   Vision:Within last year Dental:No regular dental care  DEPRESSION SCREENING    09/23/2021    8:58 AM 05/10/2021   11:06 AM 11/18/2020   11:16 AM 08/15/2019    9:28 AM  PHQ 2/9 Scores  PHQ - 2 Score 0 0 1 0  PHQ- 9 Score 12 10 9       Hypertension: taking amlodipine.   Migraines: Patient has a history of migraines for which she uses Ibuprofen and Fioricet as needed. She mostly takes Ibuprofen even if it isn't helping because the Fioricet makes  her sleepy so she can only take it at night. She takes magnesium, co-Q10, and vitamin B2 for preventive therapy.  Previously only needed abortive treatment once monthly. In February she started having daily headaches.  She wakes up with the headache and it intensifies throughout the day.    Vitamin D Deficiency: taking a twice weekly supplement. Last vitamin D level was normal in July 2021. She feels her levels are only normal because she is outside as much as possible during the summer. ***   Review of Systems  Constitutional:  Negative for chills, fever, malaise/fatigue and weight loss.  HENT:  Negative for congestion, ear discharge, ear pain, nosebleeds, sinus pain, sore throat and tinnitus.   Eyes:  Negative for blurred vision, double vision, pain, discharge and redness.  Respiratory:  Negative for cough, shortness of breath and wheezing.   Cardiovascular:  Negative for chest pain, palpitations and leg swelling.  Gastrointestinal:  Negative for abdominal pain, constipation, diarrhea, heartburn, nausea and vomiting.  Genitourinary:  Negative for dysuria, frequency and urgency.  Musculoskeletal:  Negative for myalgias.  Skin:  Negative for rash.  Neurological:  Positive for headaches. Negative for dizziness, seizures and weakness.  Psychiatric/Behavioral:  Negative for depression, substance abuse and suicidal ideas. The patient is not nervous/anxious.     Current Outpatient Medications:  .  albuterol (VENTOLIN HFA) 108 (90 Base) MCG/ACT inhaler, Inhale 2 puffs into the lungs every 6 (six) hours as needed  for wheezing or shortness of breath., Disp: 18 g, Rfl: 2 .  amLODipine (NORVASC) 5 MG tablet, Take 1 tablet (5 mg total) by mouth daily. (NEEDS TO BE SEEN BEFORE NEXT REFILL), Disp: 30 tablet, Rfl: 0 .  aspirin EC 81 MG tablet, Take 81 mg by mouth daily. Swallow whole., Disp: , Rfl:  .  butalbital-acetaminophen-caffeine (FIORICET) 50-325-40 MG tablet, Take 1-2 tablets by mouth 2 (two) times  daily as needed for headache or migraine., Disp: 14 tablet, Rfl: 5 .  cetirizine (ZYRTEC) 10 MG tablet, Take 1 tablet (10 mg total) by mouth daily., Disp: 90 tablet, Rfl: 1 .  clobetasol (TEMOVATE) 0.05 % external solution, Apply 1 Application topically 2 (two) times daily., Disp: 50 mL, Rfl: 2 .  cyclobenzaprine (FLEXERIL) 5 MG tablet, Take 1 tablet (5 mg total) by mouth 3 (three) times daily as needed for muscle spasms., Disp: 30 tablet, Rfl: 5 .  D3-50 1.25 MG (50000 UT) capsule, TAKE 1 TABLET BY MOUTH 2 (TWO) TIMES A WEEK., Disp: 24 capsule, Rfl: 1 .  montelukast (SINGULAIR) 10 MG tablet, TAKE 1 TABLET BY MOUTH EVERYDAY AT BEDTIME, Disp: 90 tablet, Rfl: 3 .  promethazine (PHENERGAN) 25 MG tablet, TAKE 1 TABLET (25 MG TOTAL) BY MOUTH EVERY 6 (SIX) HOURS AS NEEDED FOR NAUSEA OR VOMITING., Disp: 30 tablet, Rfl: 2 .  rosuvastatin (CRESTOR) 10 MG tablet, Take 1 tablet (10 mg total) by mouth daily. (NEEDS TO BE SEEN BEFORE NEXT REFILL), Disp: 30 tablet, Rfl: 0 .  azithromycin (ZITHROMAX Z-PAK) 250 MG tablet, Take 2 tablets (500 mg) PO today, then 1 tablet (250 mg) PO daily x4 days. (Patient not taking: Reported on 09/23/2021), Disp: 6 tablet, Rfl: 0  Allergies  Allergen Reactions  . Penicillins Hives    Past Medical History:  Diagnosis Date  . Allergy   . Arthritis   . Diverticulosis 10/18/2016   seen on colonoscopy  . Dyslipidemia   . Hepatic steatosis 02/12/2015   On RUQ abdominal U/S  . History of PCR DNA positive for HSV2   . Hypertension   . Hypertensive retinopathy   . Migraines   . Recurrent urinary tract infection   . Vitamin D deficiency     Past Surgical History:  Procedure Laterality Date  . BLADDER SUSPENSION  2007  . BREAST REDUCTION SURGERY  2002  . COLONOSCOPY  10/18/2016   recommended repeat in 3 years  . TONSILLECTOMY    . TOTAL ABDOMINAL HYSTERECTOMY  2007    Family History  Problem Relation Age of Onset  . Bipolar disorder Mother   . Depression Mother   .  Hyperlipidemia Mother   . Hypertension Mother   . Hyperlipidemia Father   . Hypertension Father   . Depression Father   . Hyperlipidemia Maternal Grandmother   . Diabetes Maternal Grandfather   . Cancer Paternal Grandmother   . Heart disease Paternal Grandfather   . Epilepsy Daughter   . ADD / ADHD Daughter   . AAA (abdominal aortic aneurysm) Daughter   . ADD / ADHD Son   . Allergies Son     Social History   Socioeconomic History  . Marital status: Divorced    Spouse name: Not on file  . Number of children: Not on file  . Years of education: Not on file  . Highest education level: Not on file  Occupational History  . Not on file  Tobacco Use  . Smoking status: Former    Types: Cigarettes  Quit date: 2001    Years since quitting: 22.6  . Smokeless tobacco: Never  Vaping Use  . Vaping Use: Never used  Substance and Sexual Activity  . Alcohol use: Yes    Comment: rare  . Drug use: Never  . Sexual activity: Not Currently  Other Topics Concern  . Not on file  Social History Narrative  . Not on file   Social Determinants of Health   Financial Resource Strain: Not on file  Food Insecurity: Not on file  Transportation Needs: Not on file  Physical Activity: Not on file  Stress: Not on file  Social Connections: Not on file  Intimate Partner Violence: Not on file      Objective:    BP 130/88   Pulse 70   Temp 97.8 F (36.6 C) (Temporal)   Ht 5\' 7"  (1.702 m)   Wt 245 lb (111.1 kg)   SpO2 96%   BMI 38.37 kg/m   {Vitals History (Optional):23777}  Physical Exam  Lab Results  Component Value Date   TSH 1.810 08/15/2019   Lab Results  Component Value Date   WBC 7.7 04/08/2021   HGB 13.5 04/08/2021   HCT 40.5 04/08/2021   MCV 86 04/08/2021   PLT 232 04/08/2021   Lab Results  Component Value Date   NA 138 08/15/2019   K 4.3 08/15/2019   CO2 22 08/15/2019   GLUCOSE 86 08/15/2019   BUN 9 08/15/2019   CREATININE 0.75 08/15/2019   BILITOT 0.5  08/15/2019   ALKPHOS 94 08/15/2019   AST 18 08/15/2019   ALT 18 08/15/2019   PROT 7.0 08/15/2019   ALBUMIN 4.2 08/15/2019   CALCIUM 9.7 08/15/2019   Lab Results  Component Value Date   CHOL 201 (H) 08/15/2019   Lab Results  Component Value Date   HDL 43 08/15/2019   Lab Results  Component Value Date   LDLCALC 133 (H) 08/15/2019   Lab Results  Component Value Date   TRIG 137 08/15/2019   Lab Results  Component Value Date   CHOLHDL 4.7 (H) 08/15/2019   No results found for: "HGBA1C"

## 2021-09-23 NOTE — Patient Instructions (Signed)
Start Topamax at just once daily for at least the first week, then you may increase to twice daily.

## 2021-09-24 LAB — CBC WITH DIFFERENTIAL/PLATELET
Basophils Absolute: 0.1 10*3/uL (ref 0.0–0.2)
Basos: 1 %
EOS (ABSOLUTE): 0.3 10*3/uL (ref 0.0–0.4)
Eos: 4 %
Hematocrit: 42.3 % (ref 34.0–46.6)
Hemoglobin: 13.9 g/dL (ref 11.1–15.9)
Immature Grans (Abs): 0 10*3/uL (ref 0.0–0.1)
Immature Granulocytes: 0 %
Lymphocytes Absolute: 2 10*3/uL (ref 0.7–3.1)
Lymphs: 28 %
MCH: 29 pg (ref 26.6–33.0)
MCHC: 32.9 g/dL (ref 31.5–35.7)
MCV: 88 fL (ref 79–97)
Monocytes Absolute: 0.6 10*3/uL (ref 0.1–0.9)
Monocytes: 9 %
Neutrophils Absolute: 4.1 10*3/uL (ref 1.4–7.0)
Neutrophils: 58 %
Platelets: 256 10*3/uL (ref 150–450)
RBC: 4.8 x10E6/uL (ref 3.77–5.28)
RDW: 11.9 % (ref 11.7–15.4)
WBC: 7.1 10*3/uL (ref 3.4–10.8)

## 2021-09-24 LAB — CMP14+EGFR
ALT: 43 IU/L — ABNORMAL HIGH (ref 0–32)
AST: 26 IU/L (ref 0–40)
Albumin/Globulin Ratio: 1.7 (ref 1.2–2.2)
Albumin: 4.6 g/dL (ref 3.9–4.9)
Alkaline Phosphatase: 119 IU/L (ref 44–121)
BUN/Creatinine Ratio: 14 (ref 9–23)
BUN: 10 mg/dL (ref 6–24)
Bilirubin Total: 0.3 mg/dL (ref 0.0–1.2)
CO2: 24 mmol/L (ref 20–29)
Calcium: 9.8 mg/dL (ref 8.7–10.2)
Chloride: 103 mmol/L (ref 96–106)
Creatinine, Ser: 0.74 mg/dL (ref 0.57–1.00)
Globulin, Total: 2.7 g/dL (ref 1.5–4.5)
Glucose: 92 mg/dL (ref 70–99)
Potassium: 4.5 mmol/L (ref 3.5–5.2)
Sodium: 140 mmol/L (ref 134–144)
Total Protein: 7.3 g/dL (ref 6.0–8.5)
eGFR: 102 mL/min/{1.73_m2} (ref >59)

## 2021-09-24 LAB — LIPID PANEL
Chol/HDL Ratio: 5.2 ratio — ABNORMAL HIGH (ref 0.0–4.4)
Cholesterol, Total: 239 mg/dL — ABNORMAL HIGH (ref 100–199)
HDL: 46 mg/dL (ref >39)
LDL Chol Calc (NIH): 157 mg/dL — ABNORMAL HIGH (ref 0–99)
Triglycerides: 198 mg/dL — ABNORMAL HIGH (ref 0–149)
VLDL Cholesterol Cal: 36 mg/dL (ref 5–40)

## 2021-09-24 LAB — TSH: TSH: 1.47 u[IU]/mL (ref 0.450–4.500)

## 2021-09-24 LAB — VITAMIN D 25 HYDROXY (VIT D DEFICIENCY, FRACTURES): Vit D, 25-Hydroxy: 32.1 ng/mL (ref 30.0–100.0)

## 2021-09-26 DIAGNOSIS — S233XXA Sprain of ligaments of thoracic spine, initial encounter: Secondary | ICD-10-CM | POA: Diagnosis not present

## 2021-09-26 DIAGNOSIS — E669 Obesity, unspecified: Secondary | ICD-10-CM | POA: Insufficient documentation

## 2021-09-26 DIAGNOSIS — S134XXA Sprain of ligaments of cervical spine, initial encounter: Secondary | ICD-10-CM | POA: Diagnosis not present

## 2021-09-26 DIAGNOSIS — S335XXA Sprain of ligaments of lumbar spine, initial encounter: Secondary | ICD-10-CM | POA: Diagnosis not present

## 2021-09-26 DIAGNOSIS — E785 Hyperlipidemia, unspecified: Secondary | ICD-10-CM | POA: Insufficient documentation

## 2021-10-03 DIAGNOSIS — S233XXA Sprain of ligaments of thoracic spine, initial encounter: Secondary | ICD-10-CM | POA: Diagnosis not present

## 2021-10-03 DIAGNOSIS — S134XXA Sprain of ligaments of cervical spine, initial encounter: Secondary | ICD-10-CM | POA: Diagnosis not present

## 2021-10-03 DIAGNOSIS — S335XXA Sprain of ligaments of lumbar spine, initial encounter: Secondary | ICD-10-CM | POA: Diagnosis not present

## 2021-10-10 DIAGNOSIS — S233XXA Sprain of ligaments of thoracic spine, initial encounter: Secondary | ICD-10-CM | POA: Diagnosis not present

## 2021-10-10 DIAGNOSIS — S134XXA Sprain of ligaments of cervical spine, initial encounter: Secondary | ICD-10-CM | POA: Diagnosis not present

## 2021-10-10 DIAGNOSIS — S335XXA Sprain of ligaments of lumbar spine, initial encounter: Secondary | ICD-10-CM | POA: Diagnosis not present

## 2021-10-15 ENCOUNTER — Other Ambulatory Visit: Payer: Self-pay | Admitting: Family Medicine

## 2021-10-15 DIAGNOSIS — G43019 Migraine without aura, intractable, without status migrainosus: Secondary | ICD-10-CM

## 2021-10-19 DIAGNOSIS — S233XXA Sprain of ligaments of thoracic spine, initial encounter: Secondary | ICD-10-CM | POA: Diagnosis not present

## 2021-10-19 DIAGNOSIS — S134XXA Sprain of ligaments of cervical spine, initial encounter: Secondary | ICD-10-CM | POA: Diagnosis not present

## 2021-10-19 DIAGNOSIS — S335XXA Sprain of ligaments of lumbar spine, initial encounter: Secondary | ICD-10-CM | POA: Diagnosis not present

## 2021-10-24 DIAGNOSIS — S335XXA Sprain of ligaments of lumbar spine, initial encounter: Secondary | ICD-10-CM | POA: Diagnosis not present

## 2021-10-24 DIAGNOSIS — S134XXA Sprain of ligaments of cervical spine, initial encounter: Secondary | ICD-10-CM | POA: Diagnosis not present

## 2021-10-24 DIAGNOSIS — S233XXA Sprain of ligaments of thoracic spine, initial encounter: Secondary | ICD-10-CM | POA: Diagnosis not present

## 2021-10-31 DIAGNOSIS — S233XXA Sprain of ligaments of thoracic spine, initial encounter: Secondary | ICD-10-CM | POA: Diagnosis not present

## 2021-10-31 DIAGNOSIS — S335XXA Sprain of ligaments of lumbar spine, initial encounter: Secondary | ICD-10-CM | POA: Diagnosis not present

## 2021-10-31 DIAGNOSIS — S134XXA Sprain of ligaments of cervical spine, initial encounter: Secondary | ICD-10-CM | POA: Diagnosis not present

## 2021-11-07 DIAGNOSIS — S233XXA Sprain of ligaments of thoracic spine, initial encounter: Secondary | ICD-10-CM | POA: Diagnosis not present

## 2021-11-07 DIAGNOSIS — S335XXA Sprain of ligaments of lumbar spine, initial encounter: Secondary | ICD-10-CM | POA: Diagnosis not present

## 2021-11-07 DIAGNOSIS — S134XXA Sprain of ligaments of cervical spine, initial encounter: Secondary | ICD-10-CM | POA: Diagnosis not present

## 2021-11-08 ENCOUNTER — Encounter: Payer: BC Managed Care – PPO | Admitting: Family Medicine

## 2021-11-08 NOTE — Progress Notes (Signed)
No answer. Left VM. No call back.

## 2021-11-09 ENCOUNTER — Ambulatory Visit: Payer: BC Managed Care – PPO | Admitting: Family Medicine

## 2021-11-09 ENCOUNTER — Encounter: Payer: Self-pay | Admitting: Family Medicine

## 2021-11-09 DIAGNOSIS — G43019 Migraine without aura, intractable, without status migrainosus: Secondary | ICD-10-CM

## 2021-11-09 MED ORDER — SUMATRIPTAN SUCCINATE 25 MG PO TABS: 25.0000 mg | ORAL_TABLET | ORAL | 2 refills | Status: DC | PRN

## 2021-11-09 NOTE — Progress Notes (Signed)
Virtual Visit via Telephone Note  I connected with Kelli May on 11/09/21 at 8:05 AM by telephone and verified that I am speaking with the correct person using two identifiers. Kelli May is currently located at work and nobody is currently with her during this visit. The provider, Loman Brooklyn, FNP is located in their office at time of visit.  I discussed the limitations, risks, security and privacy concerns of performing an evaluation and management service by telephone and the availability of in person appointments. I also discussed with the patient that there may be a patient responsible charge related to this service. The patient expressed understanding and agreed to proceed.  Subjective: PCP: Loman Brooklyn, FNP  Chief Complaint  Patient presents with   Migraine   Migraines: Patient has a history of migraines for which she uses Fioricet; she previously only needed this occasionally. Earlier this year, she started having daily migraines that intensified throughout the day. She takes magnesium, co-Q10, and vitamin B2 for preventive therapy.  She thought the migraines may have been due to a sinus infection or a change in her blood pressure medication. The sinus infection was treated and her blood pressure medication was changed without an effect on her migraines. She was then started on Topamax for preventive therapy and Maxalt for abortive therapy. She is only taking Topamax at night and reports the severity of her migraines has decreased, but she is still having them daily. She does not find the Maxalt helpful at all.    ROS: Per HPI  Current Outpatient Medications:    albuterol (VENTOLIN HFA) 108 (90 Base) MCG/ACT inhaler, Inhale 2 puffs into the lungs every 6 (six) hours as needed for wheezing or shortness of breath., Disp: 18 g, Rfl: 2   amLODipine (NORVASC) 5 MG tablet, Take 1 tablet (5 mg total) by mouth daily., Disp: 90 tablet, Rfl: 3   aspirin EC 81  MG tablet, Take 81 mg by mouth daily. Swallow whole., Disp: , Rfl:    butalbital-acetaminophen-caffeine (FIORICET) 50-325-40 MG tablet, Take 1-2 tablets by mouth 2 (two) times daily as needed for headache or migraine., Disp: 14 tablet, Rfl: 5   cetirizine (ZYRTEC) 10 MG tablet, Take 1 tablet (10 mg total) by mouth daily., Disp: 90 tablet, Rfl: 1   clobetasol (TEMOVATE) 0.05 % external solution, Apply 1 Application topically 2 (two) times daily., Disp: 50 mL, Rfl: 2   cyclobenzaprine (FLEXERIL) 5 MG tablet, Take 1 tablet (5 mg total) by mouth 3 (three) times daily as needed for muscle spasms., Disp: 30 tablet, Rfl: 5   D3-50 1.25 MG (50000 UT) capsule, TAKE 1 TABLET BY MOUTH 2 (TWO) TIMES A WEEK., Disp: 24 capsule, Rfl: 1   montelukast (SINGULAIR) 10 MG tablet, TAKE 1 TABLET BY MOUTH EVERYDAY AT BEDTIME, Disp: 90 tablet, Rfl: 3   promethazine (PHENERGAN) 25 MG tablet, TAKE 1 TABLET (25 MG TOTAL) BY MOUTH EVERY 6 (SIX) HOURS AS NEEDED FOR NAUSEA OR VOMITING., Disp: 30 tablet, Rfl: 2   rizatriptan (MAXALT) 5 MG tablet, TAKE 1 TABLET BY MOUTH AS NEEDED FOR MIGRAINE. MAY REPEAT IN 2 HOURS IF NEEDED, Disp: 10 tablet, Rfl: 2   rosuvastatin (CRESTOR) 10 MG tablet, Take 1 tablet (10 mg total) by mouth daily., Disp: 90 tablet, Rfl: 3   topiramate (TOPAMAX) 25 MG tablet, Take 1 tablet (25 mg total) by mouth 2 (two) times daily., Disp: 60 tablet, Rfl: 2  Allergies  Allergen Reactions   Penicillins Hives  Past Medical History:  Diagnosis Date   Allergy    Arthritis    Diverticulosis 10/18/2016   seen on colonoscopy   Dyslipidemia    Hepatic steatosis 02/12/2015   On RUQ abdominal U/S   History of PCR DNA positive for HSV2    Hypertension    Hypertensive retinopathy    Migraines    Recurrent urinary tract infection    Vitamin D deficiency     Observations/Objective: A&O  No respiratory distress or wheezing audible over the phone Mood, judgement, and thought processes all WNL  Assessment and  Plan: 1. Intractable migraine without aura and without status migrainosus Uncontrolled. Advised to increase Topamax from once to twice daily. D/C Maxalt. Start Imitrex. Advised to let me know if it is not effective and I will prescribe Nurtec.  - SUMAtriptan (IMITREX) 25 MG tablet; Take 1 tablet (25 mg total) by mouth every 2 (two) hours as needed for migraine. May repeat in 2 hours if headache persists or recurs.  Dispense: 10 tablet; Refill: 2   Follow Up Instructions: Return in about 4 weeks (around 12/07/2021) for Migraines (telephone) with Me.  I discussed the assessment and treatment plan with the patient. The patient was provided an opportunity to ask questions and all were answered. The patient agreed with the plan and demonstrated an understanding of the instructions.   The patient was advised to call back or seek an in-person evaluation if the symptoms worsen or if the condition fails to improve as anticipated.  The above assessment and management plan was discussed with the patient. The patient verbalized understanding of and has agreed to the management plan. Patient is aware to call the clinic if symptoms persist or worsen. Patient is aware when to return to the clinic for a follow-up visit. Patient educated on when it is appropriate to go to the emergency department.   Time call ended: 8:16 AM  I provided 11 minutes of non-face-to-face time during this encounter.  Deliah Boston, MSN, APRN, FNP-C [facility] 11/09/21

## 2021-11-14 DIAGNOSIS — S335XXA Sprain of ligaments of lumbar spine, initial encounter: Secondary | ICD-10-CM | POA: Diagnosis not present

## 2021-11-14 DIAGNOSIS — S134XXA Sprain of ligaments of cervical spine, initial encounter: Secondary | ICD-10-CM | POA: Diagnosis not present

## 2021-11-14 DIAGNOSIS — S233XXA Sprain of ligaments of thoracic spine, initial encounter: Secondary | ICD-10-CM | POA: Diagnosis not present

## 2021-11-16 ENCOUNTER — Other Ambulatory Visit: Payer: Self-pay | Admitting: Family Medicine

## 2021-11-16 DIAGNOSIS — Z1231 Encounter for screening mammogram for malignant neoplasm of breast: Secondary | ICD-10-CM

## 2021-11-21 DIAGNOSIS — S233XXA Sprain of ligaments of thoracic spine, initial encounter: Secondary | ICD-10-CM | POA: Diagnosis not present

## 2021-11-21 DIAGNOSIS — S335XXA Sprain of ligaments of lumbar spine, initial encounter: Secondary | ICD-10-CM | POA: Diagnosis not present

## 2021-11-21 DIAGNOSIS — S134XXA Sprain of ligaments of cervical spine, initial encounter: Secondary | ICD-10-CM | POA: Diagnosis not present

## 2021-11-28 DIAGNOSIS — S134XXA Sprain of ligaments of cervical spine, initial encounter: Secondary | ICD-10-CM | POA: Diagnosis not present

## 2021-11-28 DIAGNOSIS — S335XXA Sprain of ligaments of lumbar spine, initial encounter: Secondary | ICD-10-CM | POA: Diagnosis not present

## 2021-11-28 DIAGNOSIS — S233XXA Sprain of ligaments of thoracic spine, initial encounter: Secondary | ICD-10-CM | POA: Diagnosis not present

## 2021-12-05 DIAGNOSIS — S335XXA Sprain of ligaments of lumbar spine, initial encounter: Secondary | ICD-10-CM | POA: Diagnosis not present

## 2021-12-05 DIAGNOSIS — S233XXA Sprain of ligaments of thoracic spine, initial encounter: Secondary | ICD-10-CM | POA: Diagnosis not present

## 2021-12-05 DIAGNOSIS — S134XXA Sprain of ligaments of cervical spine, initial encounter: Secondary | ICD-10-CM | POA: Diagnosis not present

## 2021-12-12 ENCOUNTER — Encounter: Payer: Self-pay | Admitting: Family Medicine

## 2021-12-12 ENCOUNTER — Other Ambulatory Visit: Payer: Self-pay | Admitting: Family Medicine

## 2021-12-12 ENCOUNTER — Ambulatory Visit: Payer: BC Managed Care – PPO | Admitting: Family Medicine

## 2021-12-12 ENCOUNTER — Ambulatory Visit
Admission: RE | Admit: 2021-12-12 | Discharge: 2021-12-12 | Disposition: A | Discharge status: Home / Self Care | Payer: BC Managed Care – PPO | Source: Ambulatory Visit | Attending: Family Medicine | Admitting: Family Medicine

## 2021-12-12 VITALS — BP 116/77 | HR 66 | Temp 98.1°F | Ht 67.0 in | Wt 244.5 lb

## 2021-12-12 DIAGNOSIS — G43701 Chronic migraine without aura, not intractable, with status migrainosus: Secondary | ICD-10-CM

## 2021-12-12 DIAGNOSIS — S134XXA Sprain of ligaments of cervical spine, initial encounter: Secondary | ICD-10-CM | POA: Diagnosis not present

## 2021-12-12 DIAGNOSIS — S233XXA Sprain of ligaments of thoracic spine, initial encounter: Secondary | ICD-10-CM | POA: Diagnosis not present

## 2021-12-12 DIAGNOSIS — Z1231 Encounter for screening mammogram for malignant neoplasm of breast: Secondary | ICD-10-CM

## 2021-12-12 DIAGNOSIS — S335XXA Sprain of ligaments of lumbar spine, initial encounter: Secondary | ICD-10-CM | POA: Diagnosis not present

## 2021-12-12 MED ORDER — METHYLPREDNISOLONE ACETATE 80 MG/ML IJ SUSP
80.0000 mg | Freq: Once | INTRAMUSCULAR | Status: AC
  Administered 2021-12-12: 80 mg via INTRAMUSCULAR

## 2021-12-12 MED ORDER — NURTEC 75 MG PO TBDP: 75.0000 mg | ORAL_TABLET | ORAL | 2 refills | Status: DC

## 2021-12-12 MED ORDER — UBRELVY 50 MG PO TABS: 50.0000 mg | ORAL_TABLET | Freq: Every day | ORAL | 3 refills | Status: DC | PRN

## 2021-12-12 MED ORDER — KETOROLAC TROMETHAMINE 60 MG/2ML IM SOLN
60.0000 mg | Freq: Once | INTRAMUSCULAR | Status: AC
  Administered 2021-12-12: 60 mg via INTRAMUSCULAR

## 2021-12-12 NOTE — Telephone Encounter (Signed)
Name from pharmacy: [facility] 75 MG TABLET Pharmacy comment: Alternative Requested:NOT COVERED BY INSURANCE.

## 2021-12-12 NOTE — Patient Instructions (Signed)
Chronic Migraine Headache A migraine is a type of headache that is usually stronger and more sudden than other headaches. Migraines are characterized by an intense pulsing, throbbing pain that is usually only present on one side of the head. Migraine pain usually gets worse with activity. Migraines can cause nausea, vomiting, sensitivity to light and sound, and vision changes. Migraines that keep coming back are called recurrent migraines. A migraine is called a chronic migraine if it happens at least 15 days in a month for more than 3 months. Talk with your health care provider about what things may bring on (trigger) your migraines. What are the causes? The exact cause of this condition is not known. However, a migraine may be caused when nerves in the brain become irritated and release chemicals that cause inflammation of blood vessels. The inflammation of the blood vessels causes pain. Migraines may be triggered or caused by: Smoking. Certain foods and drinks, such as: Aged cheese. Chocolate. Alcohol. Caffeine. Foods or drinks that contain nitrates, glutamate, aspartame, MSG, or tyramine. Medicines, such as birth control pills or some blood pressure medicines. Other things that may trigger a migraine include: Menstruation. Emotional stress. Lack of sleep or too much sleep. Tiredness (fatigue). Bright lights or loud noises. Odors. Weather changes and high altitude. What increases the risk? The following factors may make you more likely to experience chronic migraine: Having migraines or a family history of migraines. Having a mental health condition, such as depression or anxiety. Having to take a lot of pain medicine. Having sleep problems. Having heart disease, diabetes, or obesity. What are the signs or symptoms? Symptoms of a migraine vary for each person and may include: Pulsating or throbbing pain. Pain that is usually only present on one side of the head. In some cases, the  pain may be on both sides of the head or around the head or neck. Severe pain that prevents you from doing daily activities. Pain that gets worse with physical activity. Nausea, vomiting, or both. Pain with exposure to bright lights, loud noises, or activity. General sensitivity to bright lights, loud noises, or smells. Dizziness. A sign that a migraine is becoming chronic is an increasing number of migraine episodes. It is considered chronic if the migraine happens at least 15 days in a month for more than 3 months. How is this diagnosed? This condition is often diagnosed based on: Your symptoms and medical history. A physical exam. You may also have tests, including: A CT scan or an MRI of your brain. These imaging tests cannot diagnose migraines, but they can help to rule out other causes of headaches. Taking fluid from the spine (lumbar puncture) and analyzing it (cerebrospinal fluid analysis, or CSF analysis). Blood tests. How is this treated? This condition is treated with: Medicines. These help to: Lessen pain and nausea. Prevent migraines. Lifestyle changes, such as changes to your diet or sleeping patterns. Behavior therapy. This may include: Relaxation training. Biofeedback. This is a treatment that teaches you to relax and use your brain to lower your heart rate and control your breathing. Cognitive behavioral therapy (CBT). This is a form of talk therapy. This therapy helps you set goals and follow up on the changes that you make. Acupuncture. Using a device that provides electrical stimulation to your nerves, which can relieve pain (neuromodulation therapy). Surgery, if the other treatments are not working. Follow these instructions at home: Medicines Take over-the-counter and prescription medicines only as told by your health care provider. Ask   your health care provider if the medicine prescribed to you requires you to avoid driving or using machinery. Lifestyle Do not  use any products that contain nicotine or tobacco, such as cigarettes, e-cigarettes, and chewing tobacco. If you need help quitting, ask your health care provider. Do not drink alcohol. Get 7-9 hours of sleep each night, or the amount of sleep recommended by your health care provider. Find ways to manage stress, such as meditation, deep breathing, or yoga. Maintain a healthy weight. If you need help losing weight, ask your health care provider. Exercise regularly. Aim for 150 minutes of moderate-intensity exercise, such as walking, biking, or yoga, or 75 minutes of vigorous exercise each week. Vigorous exercise includes running, circuit training, and swimming. General instructions Keep a journal to find out what triggers your migraines so you can avoid these triggers. For example, write down: What you eat and drink. How much sleep you get. Any change to your diet or medicines. Lie down in a dark, quiet room when you have a migraine. Try placing a cool towel over your head when you have a migraine. Keep lights dim, if bright lights bother you or make your migraines worse. Keep all follow-up visits as told by your health care provider. This is important. Where to find more information Coalition for Headache and Migraine Patients (CHAMP): headachemigraine.org American Migraine Foundation: americanmigrainefoundation.org National Headache Foundation: headaches.org Contact a health care provider if: Your pain does not improve, even with medicine. Your migraines continue to return, even with medicine. Get help right away if: Your migraine becomes severe and medicine does not help. You have a stiff neck and fever. You have a loss of vision. You have muscle weakness or loss of muscle control. You start losing your balance, or you have trouble walking. You feel like you may faint, or you faint. You start having sudden and unexpected, severe headaches. You have a seizure. Summary Migraine  headaches are usually stronger and more sudden than other headaches. Migraines are characterized by an intense pulsing, throbbing pain that is usually only present on one side of the head. Migraines that keep coming back are called recurrent migraines. A migraine is called a chronic migraine if it happens 15 days in a month for more than 3 months. Certain things may trigger migraines, such as lack of sleep or too much sleep, smoking, certain foods, alcohol, stress, and certain medicines. Your treatment plan may include medicines, lifestyle changes, and behavior therapy. This information is not intended to replace advice given to you by your health care provider. Make sure you discuss any questions you have with your health care provider. Document Revised: 07/14/2021 Document Reviewed: 03/19/2019 Elsevier Patient Education  2023 Elsevier Inc.  

## 2021-12-12 NOTE — Telephone Encounter (Signed)
Insurance will not cover Nurtec. I have sent in amitriptyline for prevention. Start this at bedtime after stopping topamax. I have sent in ubrelvy to take prn for migraines.

## 2021-12-12 NOTE — Progress Notes (Signed)
Established Patient Office Visit  Subjective   Patient ID: Kelli May, female    DOB: 05-16-1975  Age: 46 y.o. MRN: [id number]  Chief Complaint  Patient presents with   Migraine    Migraine  This is a chronic problem. Episode onset: 6 months. The problem occurs daily. The problem has been gradually improving. The pain is located in the Frontal region. The pain does not radiate. Quality: pressure. Associated symptoms include nausea, phonophobia and photophobia. Pertinent negatives include no blurred vision, dizziness, numbness, tingling, visual change, vomiting or weakness. The symptoms are aggravated by activity, bright light, noise and weather changes. Kelli May has tried triptans, Excedrin, acetaminophen and NSAIDs (vitamin b12, CoQ 10, magnesium, rizatripan, sumitriptan, topamax) for the symptoms. Her past medical history is significant for hypertension, migraine headaches, migraines in the family and TMJ. There is no history of obesity or recent head traumas.   Kelli May started topamax over 2 months ago. Kelli May increased her dosage to BID. This has made migraines less intense but they continue daily. Topamax makes her feel fuzzy and like Kelli May has a brain fog. This worsened when Kelli May increased her dosage. Rizatriptan did not help. Sumatriptan seems to maybe help slightly but other times not at all. Kelli May has also tried Fioricet without resolution. Excedrin, tylenol, and NSAIDs are not helpful.      12/12/2021    8:19 AM 09/23/2021    8:58 AM 05/10/2021   11:06 AM  Depression screen PHQ 2/9  Decreased Interest 0 0 0  Down, Depressed, Hopeless 0 0 0  PHQ - 2 Score 0 0 0  Altered sleeping 3 3 3   Tired, decreased energy 3 3 3   Change in appetite 3 3 3   Feeling bad or failure about yourself  0 0 0  Trouble concentrating 1 1 1   Moving slowly or fidgety/restless 0 2 0  Suicidal thoughts 0 0 0  PHQ-9 Score 10 12 10   Difficult doing work/chores Somewhat difficult Somewhat difficult Somewhat  difficult      12/12/2021    8:19 AM 09/23/2021    8:59 AM 05/10/2021   11:07 AM 11/18/2020   11:16 AM  GAD 7 : Generalized Anxiety Score  Nervous, Anxious, on Edge 0 0 0 0  Control/stop worrying 0 0 0 0  Worry too much - different things 1 0 0 0  Trouble relaxing 0 0 0 1  Restless 0 0 0 0  Easily annoyed or irritable 2 1 2 2   Afraid - awful might happen 0 1 0 0  Total GAD 7 Score 3 2 2 3   Anxiety Difficulty Somewhat difficult Somewhat difficult Somewhat difficult Not difficult at all       Review of Systems  Eyes:  Positive for photophobia. Negative for blurred vision.  Gastrointestinal:  Positive for nausea. Negative for vomiting.  Neurological:  Negative for dizziness, tingling, weakness and numbness.      Objective:     BP 116/77   Pulse 66   Temp 98.1 F (36.7 C) (Temporal)   Ht 5\' 7"  (1.702 m)   Wt 244 lb 8 oz (110.9 kg)   SpO2 98%   BMI 38.29 kg/m  BP Readings from Last 3 Encounters:  12/12/21 116/77  09/23/21 130/88  05/10/21 122/79     Physical Exam Vitals and nursing note reviewed.  Constitutional:      General: Kelli May is not in acute distress.    Appearance: Kelli May is not ill-appearing, toxic-appearing or diaphoretic.  Eyes:  General:        Right eye: No discharge.        Left eye: No discharge.     Conjunctiva/sclera: Conjunctivae normal.     Pupils: Pupils are equal, round, and reactive to light.  Cardiovascular:     Rate and Rhythm: Normal rate and regular rhythm.     Heart sounds: Normal heart sounds. No murmur heard. Pulmonary:     Effort: Pulmonary effort is normal.     Breath sounds: Normal breath sounds.  Musculoskeletal:     Right lower leg: No edema.     Left lower leg: No edema.  Skin:    General: Skin is warm and dry.  Neurological:     General: No focal deficit present.     Mental Status: Kelli May is alert and oriented to person, place, and time.     Motor: No weakness.     Gait: Gait normal.  Psychiatric:        Mood and  Affect: Mood normal.        Behavior: Behavior normal.        Thought Content: Thought content normal.        Judgment: Judgment normal.    No results found for any visits on 12/12/21.    The 10-year ASCVD risk score (Arnett DK, et al., 2019) is: 1.5%    Assessment & Plan:   Kelli May was seen today for migraine.  Diagnoses and all orders for this visit:  Chronic migraine without aura with status migrainosus, not intractable Not controlled. Kelli May has failed sumatriptan, rizatriptan, Fioricet, and Excedrin. Kelli May has also failed Topamax for prevention due to side effects, discussed weaning off this over the next week. Will try nurtec for prevention. If not covered, discussed amitriptyline for prevention and ubrelvy for abortive. Kelli May will keep a HA journal. Discussed stress management and lifestyle management. Toradol and steroid IM today. Follow up in 6 weeks, sooner for new or worsening symptoms.  -     Rimegepant Sulfate (NURTEC) 75 MG TBDP; Take 75 mg by mouth every other day. -     ketorolac (TORADOL) injection 60 mg -     methylPREDNISolone acetate (DEPO-MEDROL) injection 80 mg  Return in about 6 weeks (around 01/23/2022) for migraines, tele/virtual ok.   The patient indicates understanding of these issues and agrees with the plan.  Gwenlyn Perking, FNP

## 2021-12-13 NOTE — Telephone Encounter (Signed)
Lmtcb.

## 2021-12-16 ENCOUNTER — Telehealth: Payer: Self-pay | Admitting: *Deleted

## 2021-12-16 NOTE — Telephone Encounter (Signed)
PA for Ubrelvy-Approved from 12/16/2021 through 03/09/2022.   Left message on pt VM advising we were able to get PA approval for the Ubrelvy and to call the pharmacy to get it filled.

## 2021-12-19 DIAGNOSIS — S335XXA Sprain of ligaments of lumbar spine, initial encounter: Secondary | ICD-10-CM | POA: Diagnosis not present

## 2021-12-19 DIAGNOSIS — S233XXA Sprain of ligaments of thoracic spine, initial encounter: Secondary | ICD-10-CM | POA: Diagnosis not present

## 2021-12-19 DIAGNOSIS — S134XXA Sprain of ligaments of cervical spine, initial encounter: Secondary | ICD-10-CM | POA: Diagnosis not present

## 2021-12-25 ENCOUNTER — Other Ambulatory Visit: Payer: Self-pay | Admitting: Family Medicine

## 2021-12-25 DIAGNOSIS — G43701 Chronic migraine without aura, not intractable, with status migrainosus: Secondary | ICD-10-CM

## 2022-01-23 ENCOUNTER — Ambulatory Visit: Payer: BC Managed Care – PPO | Admitting: Family Medicine

## 2022-01-23 ENCOUNTER — Encounter: Payer: Self-pay | Admitting: Family Medicine

## 2022-01-23 DIAGNOSIS — G43701 Chronic migraine without aura, not intractable, with status migrainosus: Secondary | ICD-10-CM | POA: Diagnosis not present

## 2022-01-23 NOTE — Progress Notes (Signed)
   Virtual Visit  Note Due to COVID-19 pandemic this visit was conducted virtually. This visit type was conducted due to national recommendations for restrictions regarding the COVID-19 Pandemic (e.g. social distancing, sheltering in place) in an effort to limit this patient's exposure and mitigate transmission in our community. All issues noted in this document were discussed and addressed.  A physical exam was not performed with this format.  I connected with Karren Cobble on 01/23/22 at 0830 by telephone and verified that I am speaking with the correct person using two identifiers. Myah Guynes is currently located at home and no one is currently with her during the visit. The provider, Gabriel Earing, FNP is located in their office at time of visit.  I discussed the limitations, risks, security and privacy concerns of performing an evaluation and management service by telephone and the availability of in person appointments. I also discussed with the patient that there may be a patient responsible charge related to this service. The patient expressed understanding and agreed to proceed.  CC: migraines  History and Present Illness:  HPI Visit today is for follow up of migraines. She continues to have chronic daily migraines. And has so for 6+ months now.  She continues to have nausea, phonophobia, and photophobia. Symptoms are aggravated by activity, bright light, noise, and weather changes.She has failed triptans, excedrin, tylenol, nsaids, fioricet, vitamin b12, CoQ 10, magnesium, and topamax. At her last visit, she was switched from topamax to amitriptyline as Nurtec is not covered by her insurance. She did not start amitriptyle as she does not want to take an antidepressant. She has been taking ubrevley prn but this has not been beneficial. She is interested in a neurology referral.    ROS As per HPI.   Observations/Objective: Discussed symptomatic care and return  precautions.    Assessment and Plan: Fatima was seen today for migraine.  Diagnoses and all orders for this visit:  Chronic migraine without aura with status migrainosus, not intractable Referral to neurology for further evaluation and management.  -     Ambulatory referral to Neurology     Follow Up Instructions: As needed. Return in about 8 months (around 09/24/2022) for CPE.    I discussed the assessment and treatment plan with the patient. The patient was provided an opportunity to ask questions and all were answered. The patient agreed with the plan and demonstrated an understanding of the instructions.   The patient was advised to call back or seek an in-person evaluation if the symptoms worsen or if the condition fails to improve as anticipated.  The above assessment and management plan was discussed with the patient. The patient verbalized understanding of and has agreed to the management plan. Patient is aware to call the clinic if symptoms persist or worsen. Patient is aware when to return to the clinic for a follow-up visit. Patient educated on when it is appropriate to go to the emergency department.   Time call ended:  0841  I provided 11 minutes of  non face-to-face time during this encounter.    Gabriel Earing, FNP

## 2022-01-26 ENCOUNTER — Other Ambulatory Visit: Payer: Self-pay | Admitting: Family Medicine

## 2022-02-23 ENCOUNTER — Other Ambulatory Visit: Payer: Self-pay | Admitting: Family Medicine

## 2022-02-23 DIAGNOSIS — R519 Headache, unspecified: Secondary | ICD-10-CM

## 2022-02-27 ENCOUNTER — Telehealth: Payer: Self-pay

## 2022-02-27 NOTE — Telephone Encounter (Signed)
Kelli May (Key: [id number]) Need Help? Call us at [phone] Outcome Approved today Effective from 02/27/2022 through 05/21/2022. Drug Roselyn Meier 50MG  tablets ePA cloud logo Form Librarian, academic Form (CB)

## 2022-02-27 NOTE — Telephone Encounter (Signed)
Kelli May (Key: [id number]) Need Help? Call us at [phone] Status sent iconSent to Plan today

## 2022-03-27 ENCOUNTER — Other Ambulatory Visit: Payer: Self-pay | Admitting: Family Medicine

## 2022-03-27 DIAGNOSIS — L409 Psoriasis, unspecified: Secondary | ICD-10-CM

## 2022-05-09 ENCOUNTER — Other Ambulatory Visit: Payer: Self-pay | Admitting: Family Medicine

## 2022-05-09 DIAGNOSIS — L409 Psoriasis, unspecified: Secondary | ICD-10-CM

## 2022-05-10 ENCOUNTER — Telehealth: Payer: Self-pay | Admitting: *Deleted

## 2022-05-10 NOTE — Telephone Encounter (Signed)
Last office visit 01/23/22 Last refill 03/29/22, 50 ml, no refills

## 2022-05-10 NOTE — Telephone Encounter (Signed)
Kelli May (Key: [id number]) PA Case ID #: [id number]  Status: Sent to Plan   Drug: Roselyn Meier 50MG  tablets

## 2022-05-10 NOTE — Telephone Encounter (Signed)
Outcome Approved today Approved. Authorization Expiration Date: 05/10/2023  [facility] [place] notified

## 2022-07-03 ENCOUNTER — Other Ambulatory Visit: Payer: Self-pay | Admitting: Family Medicine

## 2022-07-03 DIAGNOSIS — L409 Psoriasis, unspecified: Secondary | ICD-10-CM

## 2022-07-03 DIAGNOSIS — R519 Headache, unspecified: Secondary | ICD-10-CM

## 2022-07-03 DIAGNOSIS — E559 Vitamin D deficiency, unspecified: Secondary | ICD-10-CM

## 2022-07-14 ENCOUNTER — Telehealth: Payer: BC Managed Care – PPO | Admitting: Family Medicine

## 2022-07-14 ENCOUNTER — Other Ambulatory Visit: Payer: Self-pay

## 2022-07-14 ENCOUNTER — Other Ambulatory Visit: Payer: Self-pay | Admitting: Family Medicine

## 2022-07-14 ENCOUNTER — Encounter: Payer: Self-pay | Admitting: Family Medicine

## 2022-07-14 DIAGNOSIS — R399 Unspecified symptoms and signs involving the genitourinary system: Secondary | ICD-10-CM

## 2022-07-14 DIAGNOSIS — Z87440 Personal history of urinary (tract) infections: Secondary | ICD-10-CM | POA: Diagnosis not present

## 2022-07-14 LAB — URINALYSIS, ROUTINE W REFLEX MICROSCOPIC
Bilirubin, UA: NEGATIVE
Glucose, UA: NEGATIVE
Ketones, UA: NEGATIVE
Leukocytes,UA: NEGATIVE
Nitrite, UA: NEGATIVE
Protein,UA: NEGATIVE
RBC, UA: NEGATIVE
Specific Gravity, UA: 1.02 (ref 1.005–1.030)
Urobilinogen, Ur: 0.2 mg/dL (ref 0.2–1.0)
pH, UA: 6 (ref 5.0–7.5)

## 2022-07-14 MED ORDER — NITROFURANTOIN MONOHYD MACRO 100 MG PO CAPS
100.0000 mg | ORAL_CAPSULE | Freq: Two times a day (BID) | ORAL | 0 refills | Status: AC
Start: 2022-07-14 — End: 2022-07-19

## 2022-07-14 NOTE — Progress Notes (Signed)
   Virtual Visit via video Note   Due to COVID-19 pandemic this visit was conducted virtually. This visit type was conducted due to national recommendations for restrictions regarding the COVID-19 Pandemic (e.g. social distancing, sheltering in place) in an effort to limit this patient's exposure and mitigate transmission in our community. All issues noted in this document were discussed and addressed.  A physical exam was not performed with this format.  I connected with  Kelli May  on 07/14/22 at 1028 by video and verified that I am speaking with the correct person using two identifiers. Kelli May is currently located at work and no one is currently with her during the visit. The provider, Gabriel Earing, FNP is located in their office at time of visit.  I discussed the limitations, risks, security and privacy concerns of performing an evaluation and management service by video  and the availability of in person appointments. I also discussed with the patient that there may be a patient responsible charge related to this service. The patient expressed understanding and agreed to proceed.  CC: UTI symptoms  History and Present Illness:  Kelli May reports a history of chronic dysuria, urgency, and frequency. She has a history of recurrent UTIs  She has had bactrim and macrobid on file for prn use. She is now out of this. She has not had any antibiotics in the last week. She denies flank pain, fever, vomiting, vaginal discharge, or hematuria. Hx of bladder sling.    ROS As per HPI.     Observations/Objective: Alert and oriented. Respirations unlabored. No cyanosis. Non toxic appearing. Normal mood and behavior.   Assessment and Plan: Diagnoses and all orders for this visit:  UTI symptoms -     Urinalysis, Routine w reflex microscopic; Future -     Urine Culture; Future  History of recurrent UTIs -     Urinalysis, Routine w reflex microscopic; Future -      Urine Culture; Future  She will come by the office to leave a urine specimen for testing. Will notify her of results and plan of care when available.    Follow Up Instructions: Return to office for new or worsening symptoms, or if symptoms persist.     I discussed the assessment and treatment plan with the patient. The patient was provided an opportunity to ask questions and all were answered. The patient agreed with the plan and demonstrated an understanding of the instructions.   The patient was advised to call back or seek an in-person evaluation if the symptoms worsen or if the condition fails to improve as anticipated.  The above assessment and management plan was discussed with the patient. The patient verbalized understanding of and has agreed to the management plan. Patient is aware to call the clinic if symptoms persist or worsen. Patient is aware when to return to the clinic for a follow-up visit. Patient educated on when it is appropriate to go to the emergency department.   Time call ended: 1033  I provided 5 minutes of face-to-face time during this encounter.    Gabriel Earing, FNP

## 2022-07-16 LAB — URINE CULTURE

## 2022-08-10 ENCOUNTER — Other Ambulatory Visit: Payer: Self-pay | Admitting: Family Medicine

## 2022-08-10 DIAGNOSIS — L409 Psoriasis, unspecified: Secondary | ICD-10-CM

## 2022-09-22 ENCOUNTER — Other Ambulatory Visit: Payer: Self-pay | Admitting: Family Medicine

## 2022-09-22 DIAGNOSIS — L409 Psoriasis, unspecified: Secondary | ICD-10-CM

## 2022-10-12 ENCOUNTER — Other Ambulatory Visit: Payer: Self-pay | Admitting: Family Medicine

## 2022-12-04 ENCOUNTER — Telehealth (INDEPENDENT_AMBULATORY_CARE_PROVIDER_SITE_OTHER): Payer: BC Managed Care – PPO | Admitting: Family Medicine

## 2022-12-04 ENCOUNTER — Encounter: Payer: Self-pay | Admitting: Family Medicine

## 2022-12-04 DIAGNOSIS — J340 Abscess, furuncle and carbuncle of nose: Secondary | ICD-10-CM

## 2022-12-04 MED ORDER — CEPHALEXIN 500 MG PO CAPS
500.0000 mg | ORAL_CAPSULE | Freq: Two times a day (BID) | ORAL | 0 refills | Status: DC
Start: 2022-12-04 — End: 2023-05-14

## 2022-12-04 NOTE — Progress Notes (Signed)
   Virtual Visit via video Note   Due to COVID-19 pandemic this visit was conducted virtually. This visit type was conducted due to national recommendations for restrictions regarding the COVID-19 Pandemic (e.g. social distancing, sheltering in place) in an effort to limit this patient's exposure and mitigate transmission in our community. All issues noted in this document were discussed and addressed.  A physical exam was not performed with this format.  I connected with  Kelli May  on 12/04/22 at 0930 by video and verified that I am speaking with the correct person using two identifiers. Kelli May is currently located at home and no one is currently with her during the visit. The provider, Gabriel Earing, FNP is located in their office at time of visit.  I discussed the limitations, risks, security and privacy concerns of performing an evaluation and management service by video  and the availability of in person appointments. I also discussed with the patient that there may be a patient responsible charge related to this service. The patient expressed understanding and agreed to proceed.  CC: nasal pain  History and Present Illness:  Kelli May reports pain on the lateral inside of the right nostril x 1 week, worsening. She is not having tenderness and swelling of the outside of her nose. She denies erythema, exudate. Denies epistaxis, shortness of breath. Denies nasal lesion. She does have a mild cough, congestion, and temperature around 100 for the last few days. She has had a negative home Covid test. She has applied ice with some improvement.    ROS As per HPI.     Observations/Objective: Alert and oriented. Respirations unlabored. No cyanosis. Non toxic appearing. Normal mood and behavior. Mild swelling of nose noted, no erythema or exudate noted.   Assessment and Plan: Kelli May was seen today for facial pain.  Diagnoses and all orders for this  visit:  Cellulitis of nose Keflex as below, warm compresses. Follow up in office for new or worsening symptoms, or if symptoms persist.  -     cephALEXin (KEFLEX) 500 MG capsule; Take 1 capsule (500 mg total) by mouth 2 (two) times daily.     Follow Up Instructions: As needed.     I discussed the assessment and treatment plan with the patient. The patient was provided an opportunity to ask questions and all were answered. The patient agreed with the plan and demonstrated an understanding of the instructions.   The patient was advised to call back or seek an in-person evaluation if the symptoms worsen or if the condition fails to improve as anticipated.  The above assessment and management plan was discussed with the patient. The patient verbalized understanding of and has agreed to the management plan. Patient is aware to call the clinic if symptoms persist or worsen. Patient is aware when to return to the clinic for a follow-up visit. Patient educated on when it is appropriate to go to the emergency department.   Time call ended: 0935  I provided 5 minutes of face-to-face time during this encounter.    Gabriel Earing, FNP

## 2022-12-14 ENCOUNTER — Other Ambulatory Visit: Payer: Self-pay | Admitting: Family Medicine

## 2022-12-14 DIAGNOSIS — E559 Vitamin D deficiency, unspecified: Secondary | ICD-10-CM

## 2023-03-31 ENCOUNTER — Other Ambulatory Visit: Payer: Self-pay | Admitting: Family Medicine

## 2023-03-31 DIAGNOSIS — R519 Headache, unspecified: Secondary | ICD-10-CM

## 2023-03-31 DIAGNOSIS — E785 Hyperlipidemia, unspecified: Secondary | ICD-10-CM

## 2023-03-31 DIAGNOSIS — G43701 Chronic migraine without aura, not intractable, with status migrainosus: Secondary | ICD-10-CM

## 2023-03-31 DIAGNOSIS — G43019 Migraine without aura, intractable, without status migrainosus: Secondary | ICD-10-CM

## 2023-03-31 DIAGNOSIS — E559 Vitamin D deficiency, unspecified: Secondary | ICD-10-CM

## 2023-04-02 ENCOUNTER — Other Ambulatory Visit: Payer: Self-pay | Admitting: Family Medicine

## 2023-04-02 DIAGNOSIS — G43019 Migraine without aura, intractable, without status migrainosus: Secondary | ICD-10-CM

## 2023-04-02 DIAGNOSIS — R519 Headache, unspecified: Secondary | ICD-10-CM

## 2023-04-02 NOTE — Telephone Encounter (Signed)
 Copied from CRM [id number]. Topic: Clinical - Medication Refill >> Apr 02, 2023  9:35 AM Elle L wrote: Most Recent Primary Care Visit:  Provider: Gabriel Earing  Department: [facility]  Visit Type: MYCHART VIDEO VISIT  Date: 12/04/2022  Medication: butalbital-acetaminophen-caffeine (FIORICET) 50-325-40 MG tablet AND promethazine (PHENERGAN) 25 MG tablet   Has the patient contacted their pharmacy? No (Agent: If no, request that the patient contact the pharmacy for the refill. If patient does not wish to contact the pharmacy document the reason why and proceed with request.) (Agent: If yes, when and what did the pharmacy advise?)  Is this the correct pharmacy for this prescription? Yes If no, delete pharmacy and type the correct one.  This is the patient's preferred pharmacy:  [id number] - [place], [place] - [address] [place] [address] Phone: [phone] Fax: [phone]  Has the prescription been filled recently? No  Is the patient out of the medication? Yes  Has the patient been seen for an appointment in the last year OR does the patient have an upcoming appointment? Yes  Can we respond through MyChart? No the patient's call back number is [phone].  Agent: Please be advised that Rx refills may take up to 3 business days. We ask that you follow-up with your pharmacy.

## 2023-05-03 ENCOUNTER — Telehealth: Payer: Self-pay | Admitting: Pharmacy Technician

## 2023-05-03 ENCOUNTER — Other Ambulatory Visit (HOSPITAL_COMMUNITY): Payer: Self-pay

## 2023-05-03 NOTE — Telephone Encounter (Signed)
 Pharmacy Patient Advocate Encounter   Received notification from CoverMyMeds that prior authorization for Ubrelvy 50MG  tablets is required/requested.   Insurance verification completed.   The patient is insured through [facility] .   Per test claim: The current 28 day co-pay is, $0.00.  No PA needed at this time. This test claim was processed through [facility]- copay amounts may vary at other pharmacies due to pharmacy/plan contracts, or as the patient moves through the different stages of their insurance plan.     Insurance will pay for #16 tablets every 28 days

## 2023-05-03 NOTE — Telephone Encounter (Signed)
 Noted. Per Tiffany pt ntbs for any refills.

## 2023-05-14 ENCOUNTER — Ambulatory Visit: Payer: Self-pay | Admitting: *Deleted

## 2023-05-14 ENCOUNTER — Ambulatory Visit (INDEPENDENT_AMBULATORY_CARE_PROVIDER_SITE_OTHER): Admitting: Family Medicine

## 2023-05-14 VITALS — BP 139/86 | HR 72 | Temp 98.6°F | Ht 67.0 in | Wt 252.8 lb

## 2023-05-14 DIAGNOSIS — G43701 Chronic migraine without aura, not intractable, with status migrainosus: Secondary | ICD-10-CM | POA: Diagnosis not present

## 2023-05-14 DIAGNOSIS — L259 Unspecified contact dermatitis, unspecified cause: Secondary | ICD-10-CM

## 2023-05-14 DIAGNOSIS — Z1331 Encounter for screening for depression: Secondary | ICD-10-CM

## 2023-05-14 DIAGNOSIS — Z79899 Other long term (current) drug therapy: Secondary | ICD-10-CM

## 2023-05-14 DIAGNOSIS — T148XXA Other injury of unspecified body region, initial encounter: Secondary | ICD-10-CM | POA: Diagnosis not present

## 2023-05-14 DIAGNOSIS — I10 Essential (primary) hypertension: Secondary | ICD-10-CM

## 2023-05-14 MED ORDER — PROMETHAZINE HCL 25 MG PO TABS: 25.0000 mg | ORAL_TABLET | Freq: Four times a day (QID) | ORAL | 0 refills | Status: DC | PRN

## 2023-05-14 MED ORDER — AMITRIPTYLINE HCL 25 MG PO TABS
25.0000 mg | ORAL_TABLET | Freq: Every day | ORAL | 1 refills | Status: DC
Start: 2023-05-14 — End: 2023-07-20

## 2023-05-14 MED ORDER — BUTALBITAL-APAP-CAFFEINE 50-325-40 MG PO TABS: 1.0000 | ORAL_TABLET | Freq: Four times a day (QID) | ORAL | 1 refills | Status: AC | PRN

## 2023-05-14 MED ORDER — TRIAMCINOLONE ACETONIDE 0.5 % EX OINT: 1.0000 | TOPICAL_OINTMENT | Freq: Two times a day (BID) | CUTANEOUS | 0 refills | Status: DC

## 2023-05-14 MED ORDER — METHYLPREDNISOLONE ACETATE 80 MG/ML IJ SUSP
80.0000 mg | Freq: Once | INTRAMUSCULAR | Status: AC
Start: 2023-05-14 — End: 2023-05-14
  Administered 2023-05-14: 80 mg via INTRAMUSCULAR

## 2023-05-14 MED ORDER — MUPIROCIN 2 % EX OINT: 1.0000 | TOPICAL_OINTMENT | Freq: Two times a day (BID) | CUTANEOUS | 0 refills | Status: AC

## 2023-05-14 NOTE — Telephone Encounter (Signed)
  Chief Complaint: rash Symptoms: itching, rash- upper left arm Frequency: 1 week Pertinent Negatives: Patient denies pain Disposition: [] ED /[] Urgent Care (no appt availability in office) / [x] Appointment(In office/virtual)/ []  [facility]/ [] Home Care/ [] Refused Recommended Disposition /[] [facility]/ []  Follow-up with PCP Additional Notes: Large rash- patient using benadryl- not helping   Copied from CRM [id number]. Topic: Clinical - Red Word Triage >> May 14, 2023  8:28 AM Ivette P wrote: Red Word that prompted transfer to Nurse Triage: rash - started a few days ago. contactdermatitis, scratched arm while cleaning. bumps on arm Reason for Disposition  Localized rash present > 7 days  Answer Assessment - Initial Assessment Questions 1. APPEARANCE of RASH: "Describe the rash."      Scratch- working on machine- exposure to chemical- rash 2. LOCATION: "Where is the rash located?"      Red rash, itches, arm- left 3. NUMBER: "How many spots are there?"      Scratch area- healing, new rash around area- possible adhesive sensitive 4. SIZE: "How big are the spots?" (Inches, centimeters or compare to size of a coin)      Elbow to shoulder-upper arm 5. ONSET: "When did the rash start?"      Over 1 week 6. ITCHING: "Does the rash itch?" If Yes, ask: "How bad is the itch?"  (Scale 0-10; or none, mild, moderate, severe)     Yes- changes- mild/moderate 7/10 7. PAIN: "Does the rash hurt?" If Yes, ask: "How bad is the pain?"  (Scale 0-10; or none, mild, moderate, severe)    - NONE (0): no pain    - MILD (1-3): doesn't interfere with normal activities     - MODERATE (4-7): interferes with normal activities or awakens from sleep     - SEVERE (8-10): excruciating pain, unable to do any normal activities     none 8. OTHER SYMPTOMS: "Do you have any other symptoms?" (e.g., fever)     no  Protocols used: Rash or Redness - Localized-A-AH

## 2023-05-14 NOTE — Progress Notes (Unsigned)
 Acute Office Visit  Subjective:     Patient ID: Kelli May, female    DOB: 04-09-1975, 48 y.o.   MRN: [id number]  Chief Complaint  Patient presents with  . Rash    HPI Kelli May is here for a rash on her left arm from earlier this week. Getting worse. She was cleaning at work around a sink that chemicals are rinsed in. She scraped one part of her left arm against a metal grate while cleaning as well. She has one area on the back of her left elbow, upper arm  that is red, bumpy, and itching. The area on her forearm that was scraped against the metal grate is raised, bumpy, and red. No exudate. Mildly itching. This area was mildly tender yesterday but this has resolved after using an abx ointment. She has also been taking benadryl and using a benadryl anti itch cream with some improvement. No fever.    Having migraines >15 x per month, nearly everyday. Sometimes mild, somedays severe. Sometimes dull, achy pain. Sometimes sharp stabbing pain. Has nausea and sometimes feels dizzy with migraines.   Was on topamax but stopped due to brain fog. Failed rizatriptin, sumitriptan, Excedrin, tylenol, and NSAIDs. She has taken Fioricet prn with relief. She is currently out of this. She is only able to take this in the evening due to drowsiness. She was previously referred to neurology but has not schedule an appt due to time constraints.       05/14/2023   10:14 AM 12/12/2021    8:19 AM 09/23/2021    8:58 AM  Depression screen PHQ 2/9  Decreased Interest 1 0 0  Down, Depressed, Hopeless 0 0 0  PHQ - 2 Score 1 0 0  Altered sleeping 2 3 3   Tired, decreased energy 3 3 3   Change in appetite 2 3 3   Feeling bad or failure about yourself  0 0 0  Trouble concentrating 1 1 1   Moving slowly or fidgety/restless 0 0 2  Suicidal thoughts 0 0 0  PHQ-9 Score 9 10 12   Difficult doing work/chores Somewhat difficult Somewhat difficult Somewhat difficult      05/14/2023   10:15 AM 12/12/2021     8:19 AM 09/23/2021    8:59 AM 05/10/2021   11:07 AM  GAD 7 : Generalized Anxiety Score  Nervous, Anxious, on Edge 0 0 0 0  Control/stop worrying 0 0 0 0  Worry too much - different things 0 1 0 0  Trouble relaxing 2 0 0 0  Restless 0 0 0 0  Easily annoyed or irritable 3 2 1 2   Afraid - awful might happen 0 0 1 0  Total GAD 7 Score 5 3 2 2   Anxiety Difficulty Not difficult at all Somewhat difficult Somewhat difficult Somewhat difficult     ROS As per HPI.       Objective:    BP 139/86   Pulse 72   Temp 98.6 F (37 C) (Temporal)   Ht 5\' 7"  (1.702 m)   Wt 252 lb 12.8 oz (114.7 kg)   SpO2 98%   BMI 39.59 kg/m  {Vitals History (Optional):23777}  Physical Exam  No results found for any visits on 05/14/23.      Assessment & Plan:   Problem List Items Addressed This Visit   None   No orders of the defined types were placed in this encounter.   No follow-ups on file.  Gabriel Earing, FNP

## 2023-05-16 ENCOUNTER — Encounter: Payer: Self-pay | Admitting: Family Medicine

## 2023-05-16 LAB — TOXASSURE SELECT 13 (MW), URINE

## 2023-07-20 ENCOUNTER — Encounter: Payer: Self-pay | Admitting: Family Medicine

## 2023-07-20 ENCOUNTER — Ambulatory Visit: Payer: BC Managed Care – PPO | Admitting: Family Medicine

## 2023-07-20 VITALS — BP 136/83 | HR 67 | Temp 98.4°F | Ht 67.0 in | Wt 248.2 lb

## 2023-07-20 DIAGNOSIS — E785 Hyperlipidemia, unspecified: Secondary | ICD-10-CM | POA: Diagnosis not present

## 2023-07-20 DIAGNOSIS — E669 Obesity, unspecified: Secondary | ICD-10-CM | POA: Diagnosis not present

## 2023-07-20 DIAGNOSIS — F321 Major depressive disorder, single episode, moderate: Secondary | ICD-10-CM

## 2023-07-20 DIAGNOSIS — Z0001 Encounter for general adult medical examination with abnormal findings: Secondary | ICD-10-CM | POA: Diagnosis not present

## 2023-07-20 DIAGNOSIS — Z6838 Body mass index (BMI) 38.0-38.9, adult: Secondary | ICD-10-CM

## 2023-07-20 DIAGNOSIS — Z0000 Encounter for general adult medical examination without abnormal findings: Secondary | ICD-10-CM

## 2023-07-20 DIAGNOSIS — G43701 Chronic migraine without aura, not intractable, with status migrainosus: Secondary | ICD-10-CM

## 2023-07-20 DIAGNOSIS — I10 Essential (primary) hypertension: Secondary | ICD-10-CM

## 2023-07-20 DIAGNOSIS — E559 Vitamin D deficiency, unspecified: Secondary | ICD-10-CM | POA: Diagnosis not present

## 2023-07-20 LAB — LIPID PANEL

## 2023-07-20 LAB — BAYER DCA HB A1C WAIVED: HB A1C (BAYER DCA - WAIVED): 4.8 % (ref 4.8–5.6)

## 2023-07-20 NOTE — Patient Instructions (Signed)

## 2023-07-20 NOTE — Progress Notes (Signed)
 Complete physical exam  Patient: Kelli May   DOB: Apr 16, 1975   48 y.o. Female  MRN: [id number]  Subjective:     Chief Complaint  Patient presents with   Annual Exam    Kimia Finan is a 48 y.o. female who presents today for a complete physical exam. She reports consuming a general diet. The patient has a physically strenuous job, but has no regular exercise apart from work.  She generally feels fairly well. She reports sleeping poorly. She does not have additional problems to discuss today.   Depression and anxiety symptoms are related to being single mother particularly navigating managing her adult daughter with special needs. She declines medication for this. She does have a family counseling appt soon.   Migraines are stable but frequent. Taking Fioricet very infrequently. Didn't start amitriptyline  and doesn't wish too.   Most recent fall risk assessment:    07/20/2023   10:21 AM  Fall Risk   Falls in the past year? 0     Most recent depression screenings:    07/20/2023   10:21 AM 05/14/2023   10:14 AM 12/12/2021    8:19 AM  Depression screen PHQ 2/9  Decreased Interest 1 1 0  Down, Depressed, Hopeless 2 0 0  PHQ - 2 Score 3 1 0  Altered sleeping 3 2 3   Tired, decreased energy 3 3 3   Change in appetite 3 2 3   Feeling bad or failure about yourself  2 0 0  Trouble concentrating 0 1 1  Moving slowly or fidgety/restless 0 0 0  Suicidal thoughts 0 0 0  PHQ-9 Score 14 9 10   Difficult doing work/chores Somewhat difficult Somewhat difficult Somewhat difficult      07/20/2023   10:22 AM 05/14/2023   10:15 AM 12/12/2021    8:19 AM 09/23/2021    8:59 AM  GAD 7 : Generalized Anxiety Score  Nervous, Anxious, on Edge 0 0 0 0  Control/stop worrying 0 0 0 0  Worry too much - different things 2 0 1 0  Trouble relaxing 2 2 0 0  Restless 0 0 0 0  Easily annoyed or irritable 3 3 2 1   Afraid - awful might happen 0 0 0 1  Total GAD 7 Score 7 5 3 2    Anxiety Difficulty Somewhat difficult Not difficult at all Somewhat difficult Somewhat difficult          Patient Care Team: Albertha Huger, FNP as PCP - General (Family Medicine)   Outpatient Medications Prior to Visit  Medication Sig   butalbital -acetaminophen-caffeine  (FIORICET) 50-325-40 MG tablet Take 1 tablet by mouth every 6 (six) hours as needed for headache.   cetirizine  (ZYRTEC ) 10 MG tablet Take 1 tablet (10 mg total) by mouth daily.   cyclobenzaprine  (FLEXERIL ) 5 MG tablet TAKE 1 TABLET BY MOUTH THREE TIMES A DAY AS NEEDED FOR MUSCLE SPASM   promethazine  (PHENERGAN ) 25 MG tablet Take 1 tablet (25 mg total) by mouth every 6 (six) hours as needed.   amitriptyline  (ELAVIL ) 25 MG tablet Take 1 tablet (25 mg total) by mouth at bedtime. (Patient not taking: Reported on 07/20/2023)   [DISCONTINUED] triamcinolone  ointment (KENALOG ) 0.5 % Apply 1 Application topically 2 (two) times daily.   No facility-administered medications prior to visit.    ROS Negative unless specially indicated above in HPI.     Objective:     BP 136/83   Pulse 67   Temp 98.4 F (36.9 C) (  Temporal)   Ht 5\' 7"  (1.702 m)   Wt 248 lb 3.2 oz (112.6 kg)   SpO2 98%   BMI 38.87 kg/m    Physical Exam Vitals and nursing note reviewed.  Constitutional:      General: She is not in acute distress.    Appearance: Normal appearance. She is not ill-appearing, toxic-appearing or diaphoretic.  HENT:     Head: Normocephalic.     Right Ear: Tympanic membrane, ear canal and external ear normal.     Left Ear: Tympanic membrane, ear canal and external ear normal.     Nose: Nose normal.     Mouth/Throat:     Mouth: Mucous membranes are dry.     Pharynx: Oropharynx is clear.  Eyes:     Extraocular Movements: Extraocular movements intact.     Conjunctiva/sclera: Conjunctivae normal.     Pupils: Pupils are equal, round, and reactive to light.  Neck:     Thyroid: No thyroid mass, thyromegaly or thyroid  tenderness.  Cardiovascular:     Rate and Rhythm: Normal rate and regular rhythm.     Pulses: Normal pulses.     Heart sounds: Normal heart sounds. No murmur heard.    No friction rub. No gallop.  Pulmonary:     Effort: Pulmonary effort is normal.     Breath sounds: Normal breath sounds.  Abdominal:     General: Bowel sounds are normal. There is no distension.     Palpations: Abdomen is soft. There is no mass.     Tenderness: There is no abdominal tenderness. There is no guarding.  Musculoskeletal:     Cervical back: No tenderness.     Right lower leg: No edema.     Left lower leg: No edema.  Skin:    General: Skin is warm and dry.     Capillary Refill: Capillary refill takes less than 2 seconds.     Findings: No lesion or rash.  Neurological:     General: No focal deficit present.     Mental Status: She is alert and oriented to person, place, and time.     Cranial Nerves: No cranial nerve deficit.     Motor: No weakness.     Gait: Gait normal.  Psychiatric:        Attention and Perception: Attention normal.        Mood and Affect: Affect is tearful.        Speech: Speech normal.        Behavior: Behavior normal.        Thought Content: Thought content normal.        Judgment: Judgment normal.      No results found for any visits on 07/20/23.     Assessment & Plan:    Routine Health Maintenance and Physical Exam  Myrakle was seen today for annual exam.  Diagnoses and all orders for this visit:  Routine general medical examination at a health care facility  Dyslipidemia Fasting panel pending.  -     Lipid panel  Primary hypertension Well controlled without medication. -     CBC with Differential/Platelet -     CMP14+EGFR -     TSH  Vitamin D  deficiency -     VITAMIN D  25 Hydroxy (Vit-D Deficiency, Fractures)  Obesity (BMI 30-39.9) Diet, exercise, weigh tloss.  -     Bayer DCA Hb A1c Waived  Chronic migraine without aura with status migrainosus, not  intractable Stale. Continue  fioricet prn. Declines preventative treatment.   Depression Denies SI. Has counseling appt upcoming. Declines treatment outside of this.   Immunization History  Administered Date(s) Administered   Influenza,Quad,Nasal, Live 11/13/2020   Tdap 06/03/2014    Health Maintenance  Topic Date Due   COVID-19 Vaccine (1) 08/04/2024 (Originally 08/08/1982)   INFLUENZA VACCINE  09/14/2023   MAMMOGRAM  12/13/2023   DTaP/Tdap/Td (2 - Td or Tdap) 06/02/2024   Colonoscopy  10/19/2026   Hepatitis C Screening  Completed   HIV Screening  Completed   HPV VACCINES  Aged Out   Meningococcal B Vaccine  Aged Out    Discussed health benefits of physical activity, and encouraged her to engage in regular exercise appropriate for her age and condition.  Problem List Items Addressed This Visit       Cardiovascular and Mediastinum   Essential hypertension   Chronic migraine without aura with status migrainosus, not intractable     Other   Vitamin D  deficiency   Relevant Orders   VITAMIN D  25 Hydroxy (Vit-D Deficiency, Fractures)   Dyslipidemia   Relevant Orders   Lipid panel   Obesity (BMI 30-39.9)   Relevant Orders   Bayer DCA Hb A1c Waived (Completed)   Other Visit Diagnoses       Routine general medical examination at a health care facility    -  Primary     Depression, major, single episode, moderate (HCC)          Return in about 1 year (around 07/19/2024), or if symptoms worsen or fail to improve, for CPE.   The patient indicates understanding of these issues and agrees with the plan.   Albertha Huger, FNP

## 2023-07-21 LAB — CMP14+EGFR
ALT: 68 IU/L — ABNORMAL HIGH (ref 0–32)
AST: 38 IU/L (ref 0–40)
Albumin: 4.5 g/dL (ref 3.9–4.9)
Alkaline Phosphatase: 117 IU/L (ref 44–121)
BUN/Creatinine Ratio: 13 (ref 9–23)
BUN: 9 mg/dL (ref 6–24)
Bilirubin Total: 0.6 mg/dL (ref 0.0–1.2)
CO2: 20 mmol/L (ref 20–29)
Calcium: 9.5 mg/dL (ref 8.7–10.2)
Chloride: 104 mmol/L (ref 96–106)
Creatinine, Ser: 0.68 mg/dL (ref 0.57–1.00)
Globulin, Total: 2.5 g/dL (ref 1.5–4.5)
Glucose: 84 mg/dL (ref 70–99)
Potassium: 4.7 mmol/L (ref 3.5–5.2)
Sodium: 140 mmol/L (ref 134–144)
Total Protein: 7 g/dL (ref 6.0–8.5)
eGFR: 109 mL/min/{1.73_m2} (ref >59)

## 2023-07-21 LAB — LIPID PANEL
Chol/HDL Ratio: 4.3 ratio (ref 0.0–4.4)
Cholesterol, Total: 199 mg/dL (ref 100–199)
HDL: 46 mg/dL (ref >39)
LDL Chol Calc (NIH): 133 mg/dL — ABNORMAL HIGH (ref 0–99)
Triglycerides: 110 mg/dL (ref 0–149)
VLDL Cholesterol Cal: 20 mg/dL (ref 5–40)

## 2023-07-21 LAB — CBC WITH DIFFERENTIAL/PLATELET
Basophils Absolute: 0.1 10*3/uL (ref 0.0–0.2)
Basos: 1 %
EOS (ABSOLUTE): 0.1 10*3/uL (ref 0.0–0.4)
Eos: 3 %
Hematocrit: 42.6 % (ref 34.0–46.6)
Hemoglobin: 13.4 g/dL (ref 11.1–15.9)
Immature Grans (Abs): 0 10*3/uL (ref 0.0–0.1)
Immature Granulocytes: 0 %
Lymphocytes Absolute: 1.5 10*3/uL (ref 0.7–3.1)
Lymphs: 29 %
MCH: 28.7 pg (ref 26.6–33.0)
MCHC: 31.5 g/dL (ref 31.5–35.7)
MCV: 91 fL (ref 79–97)
Monocytes Absolute: 0.5 10*3/uL (ref 0.1–0.9)
Monocytes: 10 %
Neutrophils Absolute: 2.9 10*3/uL (ref 1.4–7.0)
Neutrophils: 56 %
Platelets: 210 10*3/uL (ref 150–450)
RBC: 4.67 x10E6/uL (ref 3.77–5.28)
RDW: 12.4 % (ref 11.7–15.4)
WBC: 5.2 10*3/uL (ref 3.4–10.8)

## 2023-07-21 LAB — VITAMIN D 25 HYDROXY (VIT D DEFICIENCY, FRACTURES): Vit D, 25-Hydroxy: 47.5 ng/mL (ref 30.0–100.0)

## 2023-07-21 LAB — TSH: TSH: 2.13 u[IU]/mL (ref 0.450–4.500)

## 2023-07-23 ENCOUNTER — Other Ambulatory Visit: Payer: Self-pay | Admitting: Family Medicine

## 2023-07-23 ENCOUNTER — Ambulatory Visit: Payer: Self-pay | Admitting: Family Medicine

## 2023-07-23 DIAGNOSIS — E559 Vitamin D deficiency, unspecified: Secondary | ICD-10-CM

## 2023-07-23 DIAGNOSIS — G43701 Chronic migraine without aura, not intractable, with status migrainosus: Secondary | ICD-10-CM

## 2023-11-11 ENCOUNTER — Other Ambulatory Visit: Payer: Self-pay | Admitting: Family Medicine

## 2023-11-11 DIAGNOSIS — G43701 Chronic migraine without aura, not intractable, with status migrainosus: Secondary | ICD-10-CM

## 2023-12-06 NOTE — Progress Notes (Signed)
 Kelli May                                          MRN: [id number]   12/06/2023   The [facility] Quality Team Specialist reviewed this patient medical record for the purposes of chart review for care gap closure. The following were reviewed: abstraction for care gap closure-controlling blood pressure.    [facility] Quality Team

## 2024-01-14 ENCOUNTER — Ambulatory Visit: Admitting: Nurse Practitioner

## 2024-01-14 ENCOUNTER — Encounter: Payer: Self-pay | Admitting: Nurse Practitioner

## 2024-01-14 ENCOUNTER — Ambulatory Visit: Payer: Self-pay

## 2024-01-14 VITALS — BP 127/77 | HR 85 | Temp 98.1°F | Ht 67.0 in | Wt 260.0 lb

## 2024-01-14 DIAGNOSIS — J40 Bronchitis, not specified as acute or chronic: Secondary | ICD-10-CM

## 2024-01-14 MED ORDER — METHYLPREDNISOLONE ACETATE 80 MG/ML IJ SUSP
80.0000 mg | Freq: Once | INTRAMUSCULAR | Status: AC
  Administered 2024-01-14: 80 mg via INTRAMUSCULAR

## 2024-01-14 MED ORDER — PREDNISONE 20 MG PO TABS: ORAL_TABLET | ORAL | 0 refills | Status: DC

## 2024-01-14 MED ORDER — BENZONATATE 100 MG PO CAPS: 100.0000 mg | ORAL_CAPSULE | Freq: Two times a day (BID) | ORAL | 0 refills | Status: DC | PRN

## 2024-01-14 MED ORDER — AZITHROMYCIN 250 MG PO TABS: ORAL_TABLET | ORAL | 0 refills | Status: DC

## 2024-01-14 NOTE — Telephone Encounter (Signed)
 FYI Only or Action Required?: FYI only for provider: appointment scheduled on this morning with dod.  Patient was last seen in primary care on 07/20/2023 by Kelli Annabella HERO, FNP.  Called Nurse Triage reporting Dizziness cough., URI s/s. - Pt thinks she may have pneumonia  Symptoms began a week ago.  Interventions attempted: Rest, hydration, or home remedies.  Symptoms are: gradually worsening.  Triage Disposition: See Physician Within 24 Hours  Patient/caregiver understands and will follow disposition?: Yes                        Copied from CRM #[id number]. Topic: Clinical - Red Word Triage >> Jan 14, 2024  8:47 AM Kelli May wrote: Red Word that prompted transfer to Nurse Triage: Cold/dizzy/Congestion/Mucus green/running nose/Can not sleep/getting worse/since the 11/26 Reason for Disposition  [1] MODERATE dizziness (e.g., interferes with normal activities) AND [2] has NOT been evaluated by doctor (or NP/PA) for this  (Exception: Dizziness caused by heat exposure, sudden standing, or poor fluid intake.)  Protocols used: Dizziness - Lightheadedness-A-AH

## 2024-01-14 NOTE — Telephone Encounter (Signed)
 Appt today

## 2024-01-14 NOTE — Patient Instructions (Addendum)

## 2024-01-14 NOTE — Progress Notes (Signed)
 Subjective:    Patient ID: Kelli May, female    DOB: 1975/12/01, 48 y.o.   MRN: [id number]   Chief Complaint: cough and dizziness  Cough This is a new problem. The current episode started in the past 7 days. The problem has been waxing and waning. The problem occurs every few minutes. The cough is Productive of sputum. Associated symptoms include nasal congestion and rhinorrhea. Pertinent negatives include no chills, ear congestion, fever or shortness of breath. Associated symptoms comments: dizziness. Nothing aggravates the symptoms. Treatments tried: sudafed and delsym. The treatment provided mild relief.   Has history of pneumonia  Patient Active Problem List   Diagnosis Date Noted   Chronic migraine without aura with status migrainosus, not intractable 07/20/2023   Depression, major, single episode, moderate (HCC) 07/20/2023   Dyslipidemia 09/26/2021   Obesity (BMI 30-39.9) 09/26/2021   Upper airway cough syndrome 04/08/2021   Scalp psoriasis 05/12/2020   Frequent headaches 08/17/2019   DOE (dyspnea on exertion) 08/17/2019   Seasonal allergies 08/17/2019   Essential hypertension    Vitamin D  deficiency        Review of Systems  Constitutional:  Negative for chills and fever.  HENT:  Positive for congestion and rhinorrhea.   Respiratory:  Positive for cough. Negative for shortness of breath.   Neurological:  Positive for dizziness.       Objective:   Physical Exam Constitutional:      Appearance: Normal appearance. She is obese.  HENT:     Right Ear: Tympanic membrane normal.     Left Ear: Tympanic membrane normal.     Nose: Congestion and rhinorrhea present.     Mouth/Throat:     Pharynx: No oropharyngeal exudate or posterior oropharyngeal erythema.  Cardiovascular:     Rate and Rhythm: Normal rate and regular rhythm.  Pulmonary:     Effort: Pulmonary effort is normal.     Breath sounds: Normal breath sounds.     Comments: Deep barky  cough Skin:    General: Skin is warm.  Neurological:     General: No focal deficit present.     Mental Status: She is alert and oriented to person, place, and time.    BP 127/77   Pulse 85   Temp 98.1 F (36.7 C) (Temporal)   Ht 5' 7 (1.702 m)   Wt 260 lb (117.9 kg)   SpO2 97%   BMI 40.72 kg/m         Assessment & Plan:   Kelli May in today with chief complaint of Dizziness and Cough   1. Bronchitis (Primary) 1. Take meds as prescribed 2. Use a cool mist humidifier especially during the winter months and when heat has been humid. 3. Use saline nose sprays frequently 4. Saline irrigations of the nose can be very helpful if done frequently.  * 4X daily for 1 week*  * Use of a nettie pot can be helpful with this. Follow directions with this* 5. Drink plenty of fluids 6. Keep thermostat turn down low 7.For any cough or congestion- tesalon perles 8. For fever or aces or pains- take tylenol or ibuprofen appropriate for age and weight.  * for fevers greater than 101 orally you may alternate ibuprofen and tylenol every  3 hours.    - azithromycin  (ZITHROMAX  Z-PAK) 250 MG tablet; As directed  Dispense: 6 tablet; Refill: 0 - predniSONE  (DELTASONE ) 20 MG tablet; 2 po at sametime daily for 5 days-  Dispense: 10  tablet; Refill: 0 - methylPREDNISolone  acetate (DEPO-MEDROL ) injection 80 mg - benzonatate (TESSALON) 100 MG capsule; Take 1 capsule (100 mg total) by mouth 2 (two) times daily as needed for cough.  Dispense: 20 capsule; Refill: 0    The above assessment and management plan was discussed with the patient. The patient verbalized understanding of and has agreed to the management plan. Patient is aware to call the clinic if symptoms persist or worsen. Patient is aware when to return to the clinic for a follow-up visit. Patient educated on when it is appropriate to go to the emergency department.   Mary-Margaret Gladis, FNP

## 2024-02-08 ENCOUNTER — Telehealth (INDEPENDENT_AMBULATORY_CARE_PROVIDER_SITE_OTHER): Admitting: Family

## 2024-02-08 ENCOUNTER — Encounter: Payer: Self-pay | Admitting: Family

## 2024-02-08 DIAGNOSIS — J101 Influenza due to other identified influenza virus with other respiratory manifestations: Secondary | ICD-10-CM | POA: Diagnosis not present

## 2024-02-08 MED ORDER — OSELTAMIVIR PHOSPHATE 75 MG PO CAPS: 75.0000 mg | ORAL_CAPSULE | Freq: Two times a day (BID) | ORAL | 0 refills | Status: AC

## 2024-02-08 MED ORDER — BENZONATATE 200 MG PO CAPS: 200.0000 mg | ORAL_CAPSULE | Freq: Two times a day (BID) | ORAL | 0 refills | Status: AC | PRN

## 2024-02-08 NOTE — Progress Notes (Signed)
 " Virtual Visit Consent   Kelli May, you are scheduled for a virtual visit with a [facility] provider today. Just as with appointments in the office, your consent must be obtained to participate. Your consent will be active for this visit and any virtual visit you may have with one of our providers in the next 365 days. If you have a MyChart account, a copy of this consent can be sent to you electronically.  As this is a virtual visit, video technology does not allow for your provider to perform a traditional examination. This may limit your provider's ability to fully assess your condition. If your provider identifies any concerns that need to be evaluated in person or the need to arrange testing (such as labs, EKG, etc.), we will make arrangements to do so. Although advances in technology are sophisticated, we cannot ensure that it will always work on either your end or our end. If the connection with a video visit is poor, the visit may have to be switched to a telephone visit. With either a video or telephone visit, we are not always able to ensure that we have a secure connection.  By engaging in this virtual visit, you consent to the provision of healthcare and authorize for your insurance to be billed (if applicable) for the services provided during this visit. Depending on your insurance coverage, you may receive a charge related to this service.  I need to obtain your verbal consent now. Are you willing to proceed with your visit today? Anjana Cheek has provided verbal consent on 02/08/2024 for a virtual visit (video or telephone). Bari Learn, FNP  Date: 02/08/2024 3:57 PM   Virtual Visit via Video Note   I, Bari Learn, connected with  Kelli May  ([id number], January 13, 1977) on 02/08/2024 at  5:15 PM EST by a video-enabled telemedicine application and verified that I am speaking with the correct person using two identifiers.  Location: Patient:  Virtual Visit Location Patient: Other: car Provider: Virtual Visit Location Provider: Home Office   I discussed the limitations of evaluation and management by telemedicine and the availability of in person appointments. The patient expressed understanding and agreed to proceed.    History of Present Illness: Kelli May is a 48 y.o. who identifies as a female who was assigned female at birth, and is being seen today for A Flu that started two days ago. Took a test today and was positive. Her children had Flu A earlier in the week.   HPI: Influenza This is a new problem. The current episode started in the past 7 days. The problem occurs constantly. The problem has been gradually worsening. Associated symptoms include chills, congestion, coughing, fatigue, a fever, headaches, myalgias and a sore throat. Pertinent negatives include no vomiting or weakness.    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  Resting comfortably Head is normocephalic, atraumatic.  No labored breathing.  Speech is clear  and coherent with logical content.  Patient is alert and oriented at baseline.  Dry nonproductive cough Nasal congestion  Assessment and Plan: 1. Influenza A (Primary) - oseltamivir  (TAMIFLU ) 75 MG capsule; Take 1 capsule (75 mg total) by mouth 2 (two) times daily.  Dispense: 10  capsule; Refill: 0 - benzonatate  (TESSALON ) 200 MG capsule; Take 1 capsule (200 mg total) by mouth 2 (two) times daily as needed for cough.  Dispense: 20 capsule; Refill: 0  - Take meds as prescribed - Use a cool mist humidifier  -Use saline nose sprays frequently -Force fluids -For any cough or congestion  Use plain Mucinex- regular strength or max strength is fine -For fever or aces or pains- take tylenol or ibuprofen. -Throat lozenges if help -Follow up if symptoms worsen or do not improve   Follow Up Instructions: I discussed the assessment and treatment plan with the patient. The patient was provided an opportunity to ask questions and all were answered. The patient agreed with the plan and demonstrated an understanding of the instructions.  A copy of instructions were sent to the patient via MyChart unless otherwise noted below.     The patient was advised to call back or seek an in-person evaluation if the symptoms worsen or if the condition fails to improve as anticipated.    Bari Learn, FNP    [1]  Allergies Allergen Reactions   Penicillins Hives  [2]  Current Outpatient Medications:    benzonatate  (TESSALON ) 200 MG capsule, Take 1 capsule (200 mg total) by mouth 2 (two) times daily as needed for cough., Disp: 20 capsule, Rfl: 0   oseltamivir  (TAMIFLU ) 75 MG capsule, Take 1 capsule (75 mg total) by mouth 2 (two) times daily., Disp: 10 capsule, Rfl: 0   butalbital -acetaminophen-caffeine  (FIORICET) 50-325-40 MG tablet, Take 1 tablet by mouth every 6 (six) hours as needed for headache., Disp: 14 tablet, Rfl: 1   cetirizine  (ZYRTEC ) 10 MG tablet, Take 1 tablet (10 mg total) by mouth daily., Disp: 90 tablet, Rfl: 1   cyclobenzaprine  (FLEXERIL ) 5 MG tablet, TAKE 1 TABLET BY MOUTH THREE TIMES A DAY AS NEEDED FOR MUSCLE SPASM, Disp: 90 tablet, Rfl: 1   promethazine  (PHENERGAN ) 25 MG tablet, TAKE 1 TABLET BY MOUTH EVERY 6 HOURS AS NEEDED., Disp: 30 tablet, Rfl: 0  "
